# Patient Record
Sex: Female | Born: 1980 | Race: White | Hispanic: No | Marital: Married | Smoking: Never smoker
Health system: Southern US, Community
[De-identification: ages and names within clinical notes are randomized; demographics above are authoritative.]

## PROBLEM LIST (undated history)

## (undated) DIAGNOSIS — Z8742 Personal history of other diseases of the female genital tract: Secondary | ICD-10-CM

## (undated) DIAGNOSIS — E039 Hypothyroidism, unspecified: Secondary | ICD-10-CM

## (undated) DIAGNOSIS — R519 Headache, unspecified: Secondary | ICD-10-CM

## (undated) DIAGNOSIS — E079 Disorder of thyroid, unspecified: Secondary | ICD-10-CM

## (undated) HISTORY — DX: Disorder of thyroid, unspecified: E07.9

## (undated) HISTORY — DX: Personal history of other diseases of the female genital tract: Z87.42

## (undated) HISTORY — PX: ABDOMINAL HYSTERECTOMY: SHX81

---

## 2006-06-14 ENCOUNTER — Ambulatory Visit: Payer: Self-pay | Admitting: Surgery

## 2006-09-29 ENCOUNTER — Ambulatory Visit: Payer: Self-pay | Admitting: Obstetrics & Gynecology

## 2007-05-31 ENCOUNTER — Inpatient Hospital Stay: Payer: Self-pay | Admitting: Obstetrics & Gynecology

## 2008-11-26 ENCOUNTER — Ambulatory Visit: Payer: Self-pay | Admitting: Internal Medicine

## 2009-06-25 HISTORY — PX: THYROID SURGERY: SHX805

## 2011-03-08 ENCOUNTER — Emergency Department (HOSPITAL_COMMUNITY): Payer: PRIVATE HEALTH INSURANCE

## 2011-03-08 ENCOUNTER — Emergency Department (HOSPITAL_COMMUNITY)
Admission: EM | Admit: 2011-03-08 | Discharge: 2011-03-08 | Disposition: A | Discharge status: Home / Self Care | Payer: PRIVATE HEALTH INSURANCE | Attending: Emergency Medicine | Admitting: Emergency Medicine

## 2011-03-08 ENCOUNTER — Emergency Department (HOSPITAL_COMMUNITY): Payer: PRIVATE HEALTH INSURANCE | Attending: Emergency Medicine

## 2011-03-08 DIAGNOSIS — R51 Headache: Secondary | ICD-10-CM | POA: Insufficient documentation

## 2011-03-08 DIAGNOSIS — S81809A Unspecified open wound, unspecified lower leg, initial encounter: Secondary | ICD-10-CM | POA: Insufficient documentation

## 2011-03-08 DIAGNOSIS — M79609 Pain in unspecified limb: Secondary | ICD-10-CM | POA: Insufficient documentation

## 2011-03-08 DIAGNOSIS — M542 Cervicalgia: Secondary | ICD-10-CM | POA: Insufficient documentation

## 2011-03-08 DIAGNOSIS — S81009A Unspecified open wound, unspecified knee, initial encounter: Secondary | ICD-10-CM | POA: Insufficient documentation

## 2011-06-08 ENCOUNTER — Other Ambulatory Visit: Payer: Self-pay

## 2011-09-08 ENCOUNTER — Other Ambulatory Visit: Payer: Self-pay

## 2011-11-11 ENCOUNTER — Other Ambulatory Visit: Payer: Self-pay

## 2012-05-16 ENCOUNTER — Other Ambulatory Visit: Payer: Self-pay

## 2012-07-19 HISTORY — PX: INTRAUTERINE DEVICE (IUD) INSERTION: SHX5877

## 2012-12-16 ENCOUNTER — Other Ambulatory Visit: Payer: Self-pay

## 2012-12-16 LAB — TSH: Thyroid Stimulating Horm: 1.27 u[IU]/mL

## 2014-08-09 ENCOUNTER — Ambulatory Visit: Payer: Self-pay

## 2014-09-10 DIAGNOSIS — M5136 Other intervertebral disc degeneration, lumbar region: Secondary | ICD-10-CM | POA: Insufficient documentation

## 2014-09-10 DIAGNOSIS — M5116 Intervertebral disc disorders with radiculopathy, lumbar region: Secondary | ICD-10-CM | POA: Insufficient documentation

## 2014-09-15 ENCOUNTER — Ambulatory Visit: Payer: Self-pay | Admitting: Physical Medicine and Rehabilitation

## 2014-10-01 DIAGNOSIS — M5126 Other intervertebral disc displacement, lumbar region: Secondary | ICD-10-CM | POA: Insufficient documentation

## 2014-10-01 DIAGNOSIS — M5136 Other intervertebral disc degeneration, lumbar region: Secondary | ICD-10-CM | POA: Insufficient documentation

## 2015-11-15 ENCOUNTER — Telehealth: Payer: 59 | Admitting: Internal Medicine

## 2015-11-15 DIAGNOSIS — B001 Herpesviral vesicular dermatitis: Secondary | ICD-10-CM | POA: Diagnosis not present

## 2015-11-15 MED ORDER — ACYCLOVIR 400 MG PO TABS
800.0000 mg | ORAL_TABLET | Freq: Two times a day (BID) | ORAL | Status: DC
Start: 2015-11-15 — End: 2016-03-02

## 2015-11-15 NOTE — Progress Notes (Signed)
We are sorry that you are not feeling well.  Here is how we plan to help!  Based on what you have shared with me it does look like you have a viral infection.    Most cold sores or fever blisters are small fluid filled blisters around the mouth caused by herpes simplex virus.  The most common strain of the virus causing cold sores is herpes simplex virus 1.  It can be spread by skin contact, sharing eating utensils, or even sharing towels.  Cold sores are contagious to other people until dry. (Approximately 5-7 days).  Wash your hands. You can spread the virus to your eyes through handling your contact lenses after touching the lesions.  Most people experience pain at the sight or tingling sensations in their lips that may begin before the ulcers erupt.  Herpes simplex is treatable but not curable.  It may lie dormant for a long time and then reappear due to stress or prolonged sun exposure.  Many patients have success in treating their cold sores with an over the counter topical called Abreva.  You may apply the cream up to 5 times daily (maximum 10 days) until healing occurs.  If you would like to use an oral antiviral medication to speed the healing of your cold sore, I have sent a prescription to your local pharmacy Acyclovir 800 mg twice a day for 7 days    HOME CARE:   Wash your hands frequently.  Do not pick at or rub the sore.  Don't open the blisters.  Avoid kissing other people during this time.  Avoid sharing drinking glasses, eating utensils, or razors.  Do not handle contact lenses unless you have thoroughly washed your hands with soap and warm water!  Avoid oral sex during this time.  Herpes from sores on your mouth can spread to your partner's genital area.  Avoid contact with anyone who has eczema or a weakened immune system.  Cold sores are often triggered by exposure to intense sunlight, use a lip balm containing a sunscreen (SPF 30 or higher).  GET HELP RIGHT AWAY  IF:   Blisters look infected.  Blisters occur near or in the eye.  Symptoms last longer than 10 days.  Your symptoms become worse.  MAKE SURE YOU:   Understand these instructions.  Will watch your condition.  Will get help right away if you are not doing well or get worse.    Your e-visit answers were reviewed by a board certified advanced clinical practitioner to complete your personal care plan.  Depending upon the condition, your plan could have  Included both over the counter or prescription medications.    Please review your pharmacy choice.  Be sure that the pharmacy you have chosen is open so that you can pick up your prescription now.  If there is a problem you csn message your provider in MyChart to have the prescription routed to another pharmacy.    Your safety is important to us.  If you have drug allergies check our prescription carefully.  For the next 24 hours you can use MyChart to ask questions about today's visit, request a non-urgent call back, or ask for a work or school excuse from your e-visit provider.  You will get an email in the next two days asking about your experience.  I hope that your e-visit has been valuable and will speed your recovery.  

## 2015-12-25 MED FILL — LEVOTHYROXINE 125 MCG TAB: 125 | 90 days supply | Qty: 90 | Fill #1

## 2016-02-27 ENCOUNTER — Ambulatory Visit (HOSPITAL_COMMUNITY)
Admission: RE | Admit: 2016-02-27 | Discharge: 2016-02-27 | Disposition: A | Discharge status: Home / Self Care | Payer: 59 | Source: Ambulatory Visit | Attending: Family Medicine | Admitting: Family Medicine

## 2016-02-27 ENCOUNTER — Other Ambulatory Visit: Payer: Self-pay | Admitting: Family Medicine

## 2016-02-27 DIAGNOSIS — M79605 Pain in left leg: Secondary | ICD-10-CM | POA: Diagnosis not present

## 2016-02-27 DIAGNOSIS — M7989 Other specified soft tissue disorders: Secondary | ICD-10-CM | POA: Diagnosis not present

## 2016-02-27 DIAGNOSIS — M79662 Pain in left lower leg: Secondary | ICD-10-CM | POA: Diagnosis not present

## 2016-02-28 ENCOUNTER — Ambulatory Visit (INDEPENDENT_AMBULATORY_CARE_PROVIDER_SITE_OTHER): Payer: 59 | Admitting: Family Medicine

## 2016-02-28 ENCOUNTER — Other Ambulatory Visit: Payer: Self-pay | Admitting: *Deleted

## 2016-02-28 DIAGNOSIS — M79605 Pain in left leg: Secondary | ICD-10-CM

## 2016-02-28 DIAGNOSIS — M79604 Pain in right leg: Secondary | ICD-10-CM

## 2016-03-02 ENCOUNTER — Ambulatory Visit (INDEPENDENT_AMBULATORY_CARE_PROVIDER_SITE_OTHER): Payer: 59 | Admitting: Family Medicine

## 2016-03-02 VITALS — BP 91/48 | Ht 65.0 in | Wt 154.0 lb

## 2016-03-02 DIAGNOSIS — M25562 Pain in left knee: Secondary | ICD-10-CM | POA: Diagnosis not present

## 2016-03-03 DIAGNOSIS — E042 Nontoxic multinodular goiter: Secondary | ICD-10-CM | POA: Insufficient documentation

## 2016-03-03 DIAGNOSIS — E039 Hypothyroidism, unspecified: Secondary | ICD-10-CM | POA: Insufficient documentation

## 2016-03-03 DIAGNOSIS — T148XXA Other injury of unspecified body region, initial encounter: Secondary | ICD-10-CM | POA: Insufficient documentation

## 2016-03-03 NOTE — Progress Notes (Signed)
Patient has scheduled appointment but the last minute had to cancel secondary to family illness. I had our reopened the encounter. There is no charge for today.

## 2016-03-03 NOTE — Progress Notes (Signed)
Patient ID: Clearance CootsHeather W Renderos, female   DOB: 12/29/1980, 35 y.o.   MRN: [id number]  Clearance CootsHeather W Segoviano - 35 y.o. female MRN [id number]  Date of birth: 09/29/1981    SUBJECTIVE:     Chief Complaint: Left lower leg pain  HPI: Date of injury  02/22/2016 Her dog ran into her shin with his very large head. She felt immediate pain. Had significant swelling such that it looked like she had "an extra kneecap" in the middle of her shin area. She iced it. She was able to continue doing her activities. That night she started having aching pain in the area was very swollen and sore. Since that time she's had some decrease in the swelling and soreness. She's been continuing her regular activities but finds it painful to hop on that leg, painful of somebody accidentally knocks against that leg. ROS:     No unusual weight change, fever, sweats, chills.  PERTINENT  PMH / PSH FH / / SH:  Past Medical, Surgical, Social, and Family History Reviewed & Updated in the EMR.  Pertinent findings include:  No personal history diabetes mellitus No prior history of any injury to the left lower leg No history of abnormal bleeding or unusual bruising  OBJECTIVE: BP 91/48 mmHg  Ht 5\' 5"  (1.651 m)  Wt 154 lb (69.854 kg)  BMI 25.63 kg/m2  Physical Exam:  Vital signs are reviewed. GEN.: Well-developed female no acute distress LEGS: The lower legs are asymmetrical with the left one being somewhat swollen particularly in the anterior area. Is also tender to palpation in the mid shin area . The calf itself is soft and there is negative Homans sign. She has full range of motion at the knee and ankle area SKIN:there is small amount of ecchymoses at the midportion of the shin. There is a very small 2-4 mm break in the skin which has a scab on it. IMAGING: She shows me a picture taken her after the event and it looks like she has a 6 or 7 mm in diameter large swollen area mid shin. Plain film x-rays taken on April 6 are read as no acute  bony findings. There is some evidence of soft tissue swelling noted over the anterior tibia in the upper portion. I personal read of the x-rays agrees with that but there is an addition from my reading of a question of a linear defects in the medial fibula, right over the area of soft tissue swelling. This is linear, irregular, subcortical. I do not see a well-defined cortical break. ASSESSMENT & PLAN:  See problem based charting & AVS for pt instructions.

## 2016-03-03 NOTE — Assessment & Plan Note (Signed)
She still having a fair amount of pain. The swelling has improved. As she is now essentially 10 days out from the injury, the level of pain she's having with jumping or hopping is worrisome. I am concerned also that there may be a subtle finding on the initial x-ray. (occult linear fracture fibula)  We discussed possibly getting a CT scan which would give me much better look at the bone. I do not think we would need MRI. She would like to hold off on that because she's not reached her deductible in her insurance coverage. We decided to place her in a long Aircast, activities of daily living as tolerated. I discussed red flags such as swelling, redness or increased pain or anything that would indicate DVT. 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 Like to see her back in a week. If she still having pain we could potentially re-x-ray it to see if there is some delayed finding on x-ray. Additionally we could reconsider CT scan. If she's doing quite well, then we probably need to new no further workup.

## 2016-03-05 MED FILL — PROPRANOLOL 10 MG TABLET: 10 | 30 days supply | Qty: 90 | Fill #0

## 2016-03-10 ENCOUNTER — Encounter: Payer: Self-pay | Admitting: Sports Medicine

## 2016-03-10 ENCOUNTER — Ambulatory Visit (INDEPENDENT_AMBULATORY_CARE_PROVIDER_SITE_OTHER): Payer: 59 | Admitting: Sports Medicine

## 2016-03-10 VITALS — BP 103/70 | HR 72 | Ht 65.0 in | Wt 154.0 lb

## 2016-03-10 DIAGNOSIS — T148 Other injury of unspecified body region: Secondary | ICD-10-CM

## 2016-03-10 DIAGNOSIS — T148XXA Other injury of unspecified body region, initial encounter: Secondary | ICD-10-CM

## 2016-03-10 NOTE — Progress Notes (Signed)
Patient ID: Clearance CootsHeather W Sibrian, female   DOB: 01/04/1981, 35 y.o.   MRN: [id number]  CC:  Follow up of large contusion to the left anterior shin  PMI: Contusion on left shin with collision with her dog about 3 weeks ago Still tender to touch, but not real painful while walking Has discontinued wearing the aircast X-rays did not reveal any clear fracture There is extensive soft tissue swelling She has a picture that shows that the swelling was 2-3 times as large as today  She now can walk pain free Her only tenderness is if she bumps the anterior shin She stopped the Aircast over the last few days  Review of systems Denies swelling in any joint No weakness in the left leg  Physical examination Pleasant young female in no acute distress BP 103/70 mmHg  Pulse 72  Ht 5\' 5"  (1.651 m)  Wt 154 lb (69.854 kg)  BMI 25.63 kg/m2  Left anterior shin shows a swollen area that is now 3 cm long and 2 cm wide This is soft and freely movable Is tender to pressure No present discoloration  Squeeze of the tibia and fibula cause no pain Walking gait without limp   [facility] ultrasound Shows left tibial seroma This as well visualized on both longitudinal and transverse scans

## 2016-03-10 NOTE — Assessment & Plan Note (Signed)
Patient had a severe contusion which caused the bruising and swelling to resemble a fracture  She is left with a residual seroma  With her symptoms resolving I advised her that this will gradually go away  she can return if it becomes more painful

## 2016-04-01 DIAGNOSIS — E039 Hypothyroidism, unspecified: Secondary | ICD-10-CM | POA: Diagnosis not present

## 2016-04-08 DIAGNOSIS — E039 Hypothyroidism, unspecified: Secondary | ICD-10-CM | POA: Diagnosis not present

## 2016-04-08 MED FILL — LEVOTHYROXINE 137 MCG TAB: 137 | 90 days supply | Qty: 90 | Fill #0

## 2016-05-05 ENCOUNTER — Other Ambulatory Visit (HOSPITAL_COMMUNITY): Payer: Self-pay | Admitting: Neurosurgery

## 2016-05-05 DIAGNOSIS — M5136 Other intervertebral disc degeneration, lumbar region: Secondary | ICD-10-CM | POA: Diagnosis not present

## 2016-05-05 DIAGNOSIS — M47816 Spondylosis without myelopathy or radiculopathy, lumbar region: Secondary | ICD-10-CM | POA: Diagnosis not present

## 2016-05-05 DIAGNOSIS — M549 Dorsalgia, unspecified: Secondary | ICD-10-CM | POA: Diagnosis not present

## 2016-05-05 DIAGNOSIS — M5440 Lumbago with sciatica, unspecified side: Secondary | ICD-10-CM | POA: Diagnosis not present

## 2016-05-05 DIAGNOSIS — M5126 Other intervertebral disc displacement, lumbar region: Secondary | ICD-10-CM | POA: Diagnosis not present

## 2016-05-05 DIAGNOSIS — M5416 Radiculopathy, lumbar region: Secondary | ICD-10-CM | POA: Diagnosis not present

## 2016-05-05 DIAGNOSIS — M546 Pain in thoracic spine: Secondary | ICD-10-CM | POA: Diagnosis not present

## 2016-05-16 ENCOUNTER — Ambulatory Visit (HOSPITAL_COMMUNITY)
Admission: RE | Admit: 2016-05-16 | Discharge: 2016-05-16 | Disposition: A | Discharge status: Home / Self Care | Payer: 59 | Source: Ambulatory Visit | Attending: Neurosurgery | Admitting: Neurosurgery

## 2016-05-16 DIAGNOSIS — G959 Disease of spinal cord, unspecified: Secondary | ICD-10-CM | POA: Insufficient documentation

## 2016-05-16 DIAGNOSIS — M5126 Other intervertebral disc displacement, lumbar region: Secondary | ICD-10-CM | POA: Diagnosis not present

## 2016-05-16 DIAGNOSIS — M5136 Other intervertebral disc degeneration, lumbar region: Secondary | ICD-10-CM | POA: Diagnosis not present

## 2016-05-27 DIAGNOSIS — M47816 Spondylosis without myelopathy or radiculopathy, lumbar region: Secondary | ICD-10-CM | POA: Diagnosis not present

## 2016-05-27 DIAGNOSIS — M5416 Radiculopathy, lumbar region: Secondary | ICD-10-CM | POA: Diagnosis not present

## 2016-05-27 DIAGNOSIS — M5440 Lumbago with sciatica, unspecified side: Secondary | ICD-10-CM | POA: Diagnosis not present

## 2016-05-27 DIAGNOSIS — Z6825 Body mass index (BMI) 25.0-25.9, adult: Secondary | ICD-10-CM | POA: Diagnosis not present

## 2016-05-27 DIAGNOSIS — M5136 Other intervertebral disc degeneration, lumbar region: Secondary | ICD-10-CM | POA: Diagnosis not present

## 2016-06-10 DIAGNOSIS — E039 Hypothyroidism, unspecified: Secondary | ICD-10-CM | POA: Diagnosis not present

## 2016-07-07 MED FILL — LEVOTHYROXINE 137 MCG TAB: 137 | 30 days supply | Qty: 30 | Fill #1

## 2016-08-10 MED FILL — LEVOTHYROXINE 137 MCG TAB: 137 | 90 days supply | Qty: 90 | Fill #0

## 2016-08-27 ENCOUNTER — Telehealth: Payer: 59 | Admitting: Physician Assistant

## 2016-08-27 DIAGNOSIS — B9789 Other viral agents as the cause of diseases classified elsewhere: Secondary | ICD-10-CM | POA: Diagnosis not present

## 2016-08-27 DIAGNOSIS — J329 Chronic sinusitis, unspecified: Secondary | ICD-10-CM | POA: Diagnosis not present

## 2016-08-27 MED ORDER — AZELASTINE HCL 0.1 % NA SOLN: 2.0000 | Freq: Two times a day (BID) | NASAL | 12 refills | Status: DC

## 2016-08-27 NOTE — Progress Notes (Signed)

## 2016-10-05 DIAGNOSIS — Z0000 Encounter for general adult medical examination without abnormal findings: Secondary | ICD-10-CM | POA: Diagnosis not present

## 2016-10-12 DIAGNOSIS — E039 Hypothyroidism, unspecified: Secondary | ICD-10-CM | POA: Diagnosis not present

## 2016-10-12 DIAGNOSIS — Z0000 Encounter for general adult medical examination without abnormal findings: Secondary | ICD-10-CM | POA: Diagnosis not present

## 2016-11-13 MED FILL — LEVOTHYROXINE 137 MCG TAB: 137 | 90 days supply | Qty: 90 | Fill #1

## 2016-12-02 ENCOUNTER — Telehealth: Payer: 59 | Admitting: Nurse Practitioner

## 2016-12-02 DIAGNOSIS — M5442 Lumbago with sciatica, left side: Secondary | ICD-10-CM | POA: Diagnosis not present

## 2016-12-02 DIAGNOSIS — G8929 Other chronic pain: Secondary | ICD-10-CM | POA: Diagnosis not present

## 2016-12-02 MED ORDER — NAPROXEN 500 MG PO TABS: 500.0000 mg | ORAL_TABLET | Freq: Two times a day (BID) | ORAL | 1 refills | Status: DC

## 2016-12-02 MED ORDER — CYCLOBENZAPRINE HCL 10 MG PO TABS: 10.0000 mg | ORAL_TABLET | Freq: Three times a day (TID) | ORAL | 1 refills | Status: DC | PRN

## 2016-12-02 MED FILL — NAPROXEN 500 MG TABLET: 500 | 30 days supply | Qty: 60 | Fill #0

## 2016-12-02 MED FILL — CYCLOBENZAPRINE 10 MG TAB: 10 | 10 days supply | Qty: 30 | Fill #0

## 2016-12-02 NOTE — Progress Notes (Signed)

## 2017-03-04 MED FILL — LEVOTHYROXINE 137 MCG TAB: 137 | 90 days supply | Qty: 90 | Fill #0

## 2017-04-20 DIAGNOSIS — E039 Hypothyroidism, unspecified: Secondary | ICD-10-CM | POA: Diagnosis not present

## 2017-04-26 DIAGNOSIS — E039 Hypothyroidism, unspecified: Secondary | ICD-10-CM | POA: Diagnosis not present

## 2017-05-12 ENCOUNTER — Ambulatory Visit (INDEPENDENT_AMBULATORY_CARE_PROVIDER_SITE_OTHER): Payer: 59 | Admitting: Certified Nurse Midwife

## 2017-05-12 ENCOUNTER — Encounter: Payer: Self-pay | Admitting: Certified Nurse Midwife

## 2017-05-12 VITALS — BP 102/62 | HR 69 | Ht 65.5 in | Wt 161.0 lb

## 2017-05-12 DIAGNOSIS — Z87410 Personal history of cervical dysplasia: Secondary | ICD-10-CM

## 2017-05-12 DIAGNOSIS — Z124 Encounter for screening for malignant neoplasm of cervix: Secondary | ICD-10-CM | POA: Diagnosis not present

## 2017-05-12 DIAGNOSIS — Z8041 Family history of malignant neoplasm of ovary: Secondary | ICD-10-CM | POA: Diagnosis not present

## 2017-05-12 DIAGNOSIS — Z01419 Encounter for gynecological examination (general) (routine) without abnormal findings: Secondary | ICD-10-CM | POA: Diagnosis not present

## 2017-05-12 NOTE — Progress Notes (Signed)
Gynecology Annual Exam  PCP: Marisue Ivan, MD  Chief Complaint:  Chief Complaint  Patient presents with  . Gynecologic Exam    History of Present Illness:Patricia Mendez is a 36 year old Caucasian/White female , G 1 P 1 0 0 1 , who presents for her NP annual exam . She is having no significant gyn concerns. Her Mirena IUD is expiring August of this year and desires replacement at that time.  Her menses are absent due to IUD. She had spotting one occasion 3 months ago. She has some cyclical breast tenderness and bloating. The patient's past medical history is notable for a history of Hashimoto's thyroiditis/hypothyroidism/goiter.  Since her last annual GYN exam dated 12/19/2013, she has had no significant changes in her health history. Has gained 14# over the last 3 years. She is sexually active. She is currently using an IUD for contraception. Mirena IUD was replaced on 07/19/2012.  Her most recent pap smear was obtained 12/19/2013 and was NIL/negative HRHPV. Previous Pap on 06/28/2012 and was LGSIL with HRHPV+. She had a colpo in Aug 2013 which revealed CINI.   Mammogram is not applicable.  There is no family history of breast cancer.  There is a possible family history of ovarian  cancer in her 70 year old paternal aunt. Genetic testing has not been done.  The patient does do occasional self breast exams.  The patient does not smoke.  The patient does drink infrequently.  The patient does not use illegal drugs.  The patient exercises regularly.  The patient does get adequate calcium in her diet.  She had a recent cholesterol screen in 2017by PCP Dr Burnadette Pop that was normal.    The patient denies current symptoms of depression.    Review of Systems: Review of Systems  Constitutional: Negative for chills, fever and weight loss.       Positive for weight gain  HENT: Negative for congestion, sinus pain and sore throat.   Eyes: Negative for blurred vision and pain.    Respiratory: Negative for hemoptysis, shortness of breath and wheezing.   Cardiovascular: Negative for chest pain, palpitations and leg swelling.  Gastrointestinal: Negative for abdominal pain, blood in stool, diarrhea, heartburn, nausea and vomiting.  Genitourinary: Negative for dysuria, frequency, hematuria and urgency.       Positive for amenorrhea  Musculoskeletal: Negative for back pain, joint pain and myalgias.  Skin: Negative for itching and rash.  Neurological: Negative for dizziness, tingling and headaches.  Endo/Heme/Allergies: Negative for environmental allergies and polydipsia. Does not bruise/bleed easily.       Negative for hirsutism   Psychiatric/Behavioral: Negative for depression. The patient is not nervous/anxious and does not have insomnia.     Past Medical History:  Past Medical History:  Diagnosis Date  . History of abnormal cervical Pap smear   . Thyroid disease    hashimoto's, goiter    Past Surgical History:  Past Surgical History:  Procedure Laterality Date  . INTRAUTERINE DEVICE (IUD) INSERTION  07/19/2012  . THYROID SURGERY  06/25/2009   bilateral FNA    Family History:  Family History  Problem Relation Age of Onset  . Diabetes Father   . Hypertension Father   . Heart attack Father   . Hypertension Mother   . Ovarian cancer Paternal Aunt   . Lymphoma Paternal Grandmother   . Lung cancer Paternal Grandfather     Social History:  Social History   Social History  . Marital  status: Married    Spouse name: N/A  . Number of children: 1  . Years of education: N/A   Occupational History  . IT trainerClinical Informatics Manager    Social History Main Topics  . Smoking status: Never Smoker  . Smokeless tobacco: Never Used  . Alcohol use No  . Drug use: No  . Sexual activity: Yes    Partners: Male    Birth control/ protection: IUD   Other Topics Concern  . Not on file   Social History Narrative  . No narrative on file    Allergies:  No  Known Allergies  Medications: Prior to Admission medications   Medication Sig Start Date End Date Taking? Authorizing Provider  levonorgestrel (MIRENA) 20 MCG/24HR IUD 1 each by Intrauterine route once.   Yes [provider]  levothyroxine (SYNTHROID, LEVOTHROID) 137 MCG tablet  03/04/17  Yes [provider]    Physical Exam Vitals: BP 102/62   Pulse 69   Ht 5' 5.5" (1.664 m)   Wt 73 kg (161 lb)   LMP  (Exact Date)   BMI 26.38 kg/m   General: WF in NAD HEENT: normocephalic, anicteric Neck: no thyroid enlargement, no palpable nodules, no cervical lymphadenopathy  Pulmonary: No increased work of breathing, CTAB Cardiovascular: RRR, without murmur  Breast: Breast symmetrical, no tenderness, no palpable nodules or masses, no skin or nipple retraction present, no nipple discharge.  No axillary, infraclavicular or supraclavicular lymphadenopathy. Abdomen: Soft, non-tender, non-distended.  Umbilicus without lesions.  No hepatomegaly or masses palpable. No evidence of hernia. Genitourinary:  External: Normal external female genitalia.  Normal urethral meatus, normal Bartholin's and Skene's glands.    Vagina: Normal vaginal mucosa, no evidence of prolapse.    Cervix: Grossly normal in appearance, no bleeding, non-tender, IUD strings visible  Uterus: Anteflexeded, normal size, shape, and consistency, mobile, and non-tender  Adnexa: No adnexal masses, non-tender  Rectal: deferred  Lymphatic: no evidence of inguinal lymphadenopathy Extremities: no edema, erythema, or tenderness Neurologic: Grossly intact Psychiatric: mood appropriate, affect full     Assessment: 36 y.o. G1P1001 normal well woman exam Family history of ovarian cancer.  Plan:   1) Breast cancer screening - recommend monthly self breast exam. Interested in baseline mammogram. Advised to check with insurance to see if covered before age 37.  2) Cervical cancer screening - Pap was done. ASCCP guidelines  and rational discussed.    3) Contraception -Advised to call to schedule either Mirena or Liletta IUD in August/Sept. Follow up at that time  4) Routine healthcare maintenance including cholesterol and diabetes screening managed by PCP   5). Offered MYRISK testing-patient considering.  Patricia Connersolleen Terren Mendez, CNM

## 2017-05-16 LAB — IGP,CTNG,APTIMAHPV
Chlamydia, Nuc. Acid Amp: NEGATIVE
GONOCOCCUS BY NUCLEIC ACID AMP: NEGATIVE
HPV APTIMA: POSITIVE — AB
PAP SMEAR COMMENT: 0

## 2017-05-17 ENCOUNTER — Encounter: Payer: Self-pay | Admitting: Certified Nurse Midwife

## 2017-05-17 DIAGNOSIS — Z8041 Family history of malignant neoplasm of ovary: Secondary | ICD-10-CM | POA: Insufficient documentation

## 2017-05-20 ENCOUNTER — Telehealth: Payer: 59 | Admitting: Family

## 2017-05-20 DIAGNOSIS — B001 Herpesviral vesicular dermatitis: Secondary | ICD-10-CM

## 2017-05-20 MED ORDER — VALACYCLOVIR HCL 500 MG PO TABS: 2000.0000 mg | ORAL_TABLET | Freq: Two times a day (BID) | ORAL | 0 refills | Status: AC

## 2017-05-20 MED FILL — VALACYCLOVIR HCL 500 MG TAB: 500 | 1 days supply | Qty: 8 | Fill #0

## 2017-05-20 NOTE — Progress Notes (Signed)
Thank you for the details you put in the comment boxes. Those details really help us take better care of you.   We are sorry that you are not feeling well.  Here is how we plan to help!  Based on what you have shared with me it does look like you have a viral infection.    Most cold sores or fever blisters are small fluid filled blisters around the mouth caused by herpes simplex virus.  The most common strain of the virus causing cold sores is herpes simplex virus 1.  It can be spread by skin contact, sharing eating utensils, or even sharing towels.  Cold sores are contagious to other people until dry. (Approximately 5-7 days).  Wash your hands. You can spread the virus to your eyes through handling your contact lenses after touching the lesions.  Most people experience pain at the sight or tingling sensations in their lips that may begin before the ulcers erupt.  Herpes simplex is treatable but not curable.  It may lie dormant for a long time and then reappear due to stress or prolonged sun exposure.  Many patients have success in treating their cold sores with an over the counter topical called Abreva.  You may apply the cream up to 5 times daily (maximum 10 days) until healing occurs.  If you would like to use an oral antiviral medication to speed the healing of your cold sore, I have sent a prescription to your local pharmacy Valacyclovir 2 gm twice daily for 1 day    HOME CARE:   Wash your hands frequently.  Do not pick at or rub the sore.  Don't open the blisters.  Avoid kissing other people during this time.  Avoid sharing drinking glasses, eating utensils, or razors.  Do not handle contact lenses unless you have thoroughly washed your hands with soap and warm water!  Avoid oral sex during this time.  Herpes from sores on your mouth can spread to your partner's genital area.  Avoid contact with anyone who has eczema or a weakened immune system.  Cold sores are often triggered  by exposure to intense sunlight, use a lip balm containing a sunscreen (SPF 30 or higher).  GET HELP RIGHT AWAY IF:   Blisters look infected.  Blisters occur near or in the eye.  Symptoms last longer than 10 days.  Your symptoms become worse.  MAKE SURE YOU:   Understand these instructions.  Will watch your condition.  Will get help right away if you are not doing well or get worse.    Your e-visit answers were reviewed by a board certified advanced clinical practitioner to complete your personal care plan.  Depending upon the condition, your plan could have  Included both over the counter or prescription medications.    Please review your pharmacy choice.  Be sure that the pharmacy you have chosen is open so that you can pick up your prescription now.  If there is a problem you csn message your provider in MyChart to have the prescription routed to another pharmacy.    Your safety is important to us.  If you have drug allergies check our prescription carefully.  For the next 24 hours you can use MyChart to ask questions about today's visit, request a non-urgent call back, or ask for a work or school excuse from your e-visit provider.  You will get an email in the next two days asking about your experience.  I hope that   your e-visit has been valuable and will speed your recovery.  

## 2017-05-28 ENCOUNTER — Encounter: Payer: Self-pay | Admitting: Certified Nurse Midwife

## 2017-06-08 MED FILL — LEVOTHYROXINE 137 MCG TAB: 137 | 90 days supply | Qty: 90 | Fill #1

## 2017-07-02 ENCOUNTER — Telehealth: Payer: Self-pay | Admitting: Certified Nurse Midwife

## 2017-07-02 NOTE — Telephone Encounter (Signed)
Pt is schedule 07/29/17 with [facility] for mirena removal and insertion

## 2017-07-07 NOTE — Telephone Encounter (Signed)
Noted. Will order to arrive by apt date/time. 

## 2017-07-29 ENCOUNTER — Ambulatory Visit (INDEPENDENT_AMBULATORY_CARE_PROVIDER_SITE_OTHER): Payer: 59 | Admitting: Certified Nurse Midwife

## 2017-07-29 ENCOUNTER — Encounter: Payer: Self-pay | Admitting: Certified Nurse Midwife

## 2017-07-29 VITALS — BP 98/62 | HR 72 | Ht 65.0 in | Wt 170.0 lb

## 2017-07-29 DIAGNOSIS — Z30433 Encounter for removal and reinsertion of intrauterine contraceptive device: Secondary | ICD-10-CM

## 2017-07-29 MED ORDER — LEVONORGESTREL 20 MCG/24HR IU IUD: 1.0000 | INTRAUTERINE_SYSTEM | Freq: Once | INTRAUTERINE | 0 refills | Status: DC

## 2017-07-29 NOTE — Progress Notes (Signed)
    GYNECOLOGY OFFICE PROCEDURE NOTE  Patricia CootsHeather W Mendez is a 36 y.o. G1P1001 here for Mirena IUD replacement. Her previous Mirena was placed 07/19/2012 and is expiring. She has benn amenorrheic on the Mirena.  Last pap smear was on 05/12/2017 and was negative with positive HRHPV  IUD Insertion Procedure Note Patient identified, informed consent performed, consent signed.   Discussed risks of irregular bleeding, cramping, infection, expulsion,malpositioning or misplacement of the IUD outside the uterus which may require further procedure such as laparoscopy. Time out was performed.   On bimanual exam, uterus was anteflexed. Speculum placed in the vagina. Expiring IUD strings visualized. Cervix cleaned with Betadine x 3. The IUD strings were grasped with a packing forceps and the IUD was removed easily and intact. Cervix was sprayed with Hurricaine anesthetic and  grasped anteriorly with a single tooth tenaculum.  Uterus sounded to 6 cm.  Mirena  IUD placed per manufacturer's recommendations.  Strings trimmed to 3 cm. Tenaculum was removed, and silver nitrate was applied to tenaculum sites for hemostasis.  Patient tolerated procedure well.   Patient was given post-procedure instructions.  Patient was also asked to check IUD strings periodically and follow up in June of next year and prn. She is aware that the IUD will expire in 5 years.  Patricia Mendez, CNM 07/29/17

## 2017-08-30 ENCOUNTER — Encounter: Payer: Self-pay | Admitting: Certified Nurse Midwife

## 2017-08-30 ENCOUNTER — Ambulatory Visit (INDEPENDENT_AMBULATORY_CARE_PROVIDER_SITE_OTHER): Payer: 59 | Admitting: Certified Nurse Midwife

## 2017-08-30 VITALS — BP 90/60 | HR 63 | Ht 65.0 in | Wt 172.0 lb

## 2017-08-30 DIAGNOSIS — Z30431 Encounter for routine checking of intrauterine contraceptive device: Secondary | ICD-10-CM | POA: Diagnosis not present

## 2017-09-06 NOTE — Progress Notes (Signed)
  History of Present Illness:  Patricia Mendez is a 36 y.o. that had a Mirena IUD replaced approximately 1 month ago. Since that time, she states that she has had no bleeding or pain  PMHx: She  has a past medical history of History of abnormal cervical Pap smear and Thyroid disease. Also,  has a past surgical history that includes Thyroid surgery (06/25/2009) and Intrauterine device (iud) insertion (07/19/2012)., family history includes Diabetes in her father; Heart attack in her father; Hypertension in her father and mother; Lung cancer in her paternal grandfather; Lymphoma in her paternal grandmother; Ovarian cancer in her paternal aunt.,  reports that she has never smoked. She has never used smokeless tobacco. She reports that she does not drink alcohol or use drugs.  She has a current medication list which includes the following prescription(s): levothyroxine and levonorgestrel. Also, has No Known Allergies.  ROS  Physical Exam:  BP 90/60   Pulse 63   Ht  (1.651 m)   Wt 172 lb (78 kg)   BMI 28.62 kg/m  Body mass index is 28.62 kg/m. Constitutional: Well nourished, well developed female in no acute distress.  Abdomen: diffusely non tender to palpation Neuro: Grossly intact Psych:  Normal mood and affect.    Pelvic exam: External/BUS: no lesion, no discharge Vagina: no lesions, no bleeding Cervix: Two IUD strings present at the cervical os about 2-3cm in length   Assessment: IUD strings present in proper location; pt doing well  Plan:  Will see her back for her annual in June 2019 and prn  Farrel Conners, CNM

## 2017-09-08 ENCOUNTER — Telehealth: Payer: 59 | Admitting: Family

## 2017-09-08 DIAGNOSIS — J019 Acute sinusitis, unspecified: Secondary | ICD-10-CM

## 2017-09-08 MED ORDER — AMOXICILLIN-POT CLAVULANATE 875-125 MG PO TABS: 1.0000 | ORAL_TABLET | Freq: Two times a day (BID) | ORAL | 0 refills | Status: DC

## 2017-09-08 NOTE — Progress Notes (Signed)

## 2017-09-09 MED FILL — AMOX-CLAV 875-125 MG TABLET: 875-125 | 7 days supply | Qty: 14 | Fill #0

## 2017-09-09 MED FILL — LEVOTHYROXINE 137 MCG TAB: 137 | 90 days supply | Qty: 90 | Fill #0

## 2017-12-13 MED FILL — LEVOTHYROXINE 137 MCG TABLE: 137 | 90 days supply | Qty: 90 | Fill #1

## 2018-03-02 MED FILL — LEVOTHYROXINE 137 MCG TABLE: 137 | 90 days supply | Qty: 90 | Fill #0

## 2018-03-22 DIAGNOSIS — Z0000 Encounter for general adult medical examination without abnormal findings: Secondary | ICD-10-CM | POA: Diagnosis not present

## 2018-03-29 DIAGNOSIS — Z0000 Encounter for general adult medical examination without abnormal findings: Secondary | ICD-10-CM | POA: Diagnosis not present

## 2018-03-29 DIAGNOSIS — E039 Hypothyroidism, unspecified: Secondary | ICD-10-CM | POA: Diagnosis not present

## 2018-06-21 MED FILL — LEVOTHYROXINE 137 MCG TABLE: 137 | 90 days supply | Qty: 90 | Fill #0

## 2018-09-16 MED FILL — LEVOTHYROXINE 137 MCG TABLE: 137 | 90 days supply | Qty: 90 | Fill #0

## 2018-10-14 DIAGNOSIS — E039 Hypothyroidism, unspecified: Secondary | ICD-10-CM | POA: Diagnosis not present

## 2018-10-19 DIAGNOSIS — E039 Hypothyroidism, unspecified: Secondary | ICD-10-CM | POA: Diagnosis not present

## 2018-10-19 DIAGNOSIS — E669 Obesity, unspecified: Secondary | ICD-10-CM | POA: Diagnosis not present

## 2018-10-19 MED FILL — LEVOTHYROXINE 150 MCG TAB: 150 | 30 days supply | Qty: 30 | Fill #0

## 2018-10-31 MED FILL — LEVOTHYROXINE 150 MCG TAB: 150 | 30 days supply | Qty: 30 | Fill #0

## 2018-11-28 MED FILL — LEVOTHYROXINE 150 MCG TAB: 150 | 30 days supply | Qty: 30 | Fill #1

## 2019-01-02 MED FILL — LEVOTHYROXINE 150 MCG TAB: 150 | 30 days supply | Qty: 30 | Fill #2

## 2019-01-26 MED FILL — LEVOTHYROXINE 150 MCG TAB: 150 | 90 days supply | Qty: 90 | Fill #0 | Status: TO

## 2019-04-25 MED FILL — LEVOTHYROXINE 150 MCG TAB: 150 | 90 days supply | Qty: 90 | Fill #0

## 2019-08-01 MED FILL — LEVOTHYROXINE 150 MCG TAB: 150 | 90 days supply | Qty: 90 | Fill #0

## 2019-10-24 MED FILL — LEVOTHYROXINE SODIUM 150 MC: 150 | 90 days supply | Qty: 90 | Fill #0

## 2020-01-05 ENCOUNTER — Telehealth: Payer: No Typology Code available for payment source | Admitting: Emergency Medicine

## 2020-01-05 ENCOUNTER — Ambulatory Visit: Payer: 59

## 2020-01-05 DIAGNOSIS — B001 Herpesviral vesicular dermatitis: Secondary | ICD-10-CM

## 2020-01-05 MED ORDER — ACYCLOVIR 800 MG PO TABS: 800.0000 mg | ORAL_TABLET | Freq: Two times a day (BID) | ORAL | 0 refills | Status: DC

## 2020-01-05 MED FILL — ACYCLOVIR 800 MG TABLET: 800 | 7 days supply | Qty: 14 | Fill #0

## 2020-01-05 NOTE — Progress Notes (Signed)
We are sorry that you are not feeling well.  Here is how we plan to help!  Based on what you have shared with me it does look like you have a viral infection.    Most cold sores or fever blisters are small fluid filled blisters around the mouth caused by herpes simplex virus.  The most common strain of the virus causing cold sores is herpes simplex virus 1.  It can be spread by skin contact, sharing eating utensils, or even sharing towels.  Cold sores are contagious to other people until dry. (Approximately 5-7 days).  Wash your hands. You can spread the virus to your eyes through handling your contact lenses after touching the lesions.  Most people experience pain at the sight or tingling sensations in their lips that may begin before the ulcers erupt.  Herpes simplex is treatable but not curable.  It may lie dormant for a long time and then reappear due to stress or prolonged sun exposure.  Many patients have success in treating their cold sores with an over the counter topical called Abreva.  You may apply the cream up to 5 times daily (maximum 10 days) until healing occurs.  If you would like to use an oral antiviral medication to speed the healing of your cold sore, I have sent a prescription to your local pharmacy Acyclovir 800 mg take one by mouth twice a day for 7 days    HOME CARE:   Wash your hands frequently.  Do not pick at or rub the sore.  Don't open the blisters.  Avoid kissing other people during this time.  Avoid sharing drinking glasses, eating utensils, or razors.  Do not handle contact lenses unless you have thoroughly washed your hands with soap and warm water!  Avoid oral sex during this time.  Herpes from sores on your mouth can spread to your partner's genital area.  Avoid contact with anyone who has eczema or a weakened immune system.  Cold sores are often triggered by exposure to intense sunlight, use a lip balm containing a sunscreen (SPF 30 or  higher).  GET HELP RIGHT AWAY IF:   Blisters look infected.  Blisters occur near or in the eye.  Symptoms last longer than 10 days.  Your symptoms become worse.  MAKE SURE YOU:   Understand these instructions.  Will watch your condition.  Will get help right away if you are not doing well or get worse.    Your e-visit answers were reviewed by a board certified advanced clinical practitioner to complete your personal care plan.  Depending upon the condition, your plan could have  Included both over the counter or prescription medications.    Please review your pharmacy choice.  Be sure that the pharmacy you have chosen is open so that you can pick up your prescription now.  If there is a problem you can message your provider in MyChart to have the prescription routed to another pharmacy.    Your safety is important to us.  If you have drug allergies check our prescription carefully.  For the next 24 hours you can use MyChart to ask questions about today's visit, request a non-urgent call back, or ask for a work or school excuse from your e-visit provider.  You will get an email in the next two days asking about your experience.  I hope that your e-visit has been valuable and will speed your recovery.   Approximately 5 minutes was used in   reviewing the patient's chart, questionnaire, prescribing medications, and documentation.

## 2020-02-09 MED FILL — LEVOTHYROXINE SODIUM 150 MC: 150 | 90 days supply | Qty: 90 | Fill #0

## 2020-05-15 MED FILL — LEVOTHYROXINE SODIUM 150 MC: 150 | 90 days supply | Qty: 90 | Fill #1

## 2020-08-21 ENCOUNTER — Other Ambulatory Visit (HOSPITAL_COMMUNITY): Payer: Self-pay | Admitting: Family Medicine

## 2020-08-21 MED FILL — LEVOTHYROXINE SODIUM 150 MC: 150 | 90 days supply | Qty: 90 | Fill #0

## 2020-09-02 ENCOUNTER — Telehealth: Payer: No Typology Code available for payment source | Admitting: Emergency Medicine

## 2020-09-02 DIAGNOSIS — B001 Herpesviral vesicular dermatitis: Secondary | ICD-10-CM | POA: Diagnosis not present

## 2020-09-02 MED ORDER — VALACYCLOVIR HCL 1 G PO TABS: 2000.0000 mg | ORAL_TABLET | Freq: Two times a day (BID) | ORAL | 0 refills | Status: AC

## 2020-09-02 NOTE — Progress Notes (Signed)
We are sorry that you are not feeling well.  Here is how we plan to help!  Based on what you have shared with me it does look like you have a viral infection.    Most cold sores or fever blisters are small fluid filled blisters around the mouth caused by herpes simplex virus.  The most common strain of the virus causing cold sores is herpes simplex virus 1.  It can be spread by skin contact, sharing eating utensils, or even sharing towels.  Cold sores are contagious to other people until dry. (Approximately 5-7 days).  Wash your hands. You can spread the virus to your eyes through handling your contact lenses after touching the lesions.  Most people experience pain at the sight or tingling sensations in their lips that may begin before the ulcers erupt.  Herpes simplex is treatable but not curable.  It may lie dormant for a long time and then reappear due to stress or prolonged sun exposure.  Many patients have success in treating their cold sores with an over the counter topical called Abreva.  You may apply the cream up to 5 times daily (maximum 10 days) until healing occurs.  If you would like to use an oral antiviral medication to speed the healing of your cold sore, I have sent a prescription to your local pharmacy Valacyclovir 2 gm take one by mouth twice a day for 1 day    HOME CARE:   Wash your hands frequently.  Do not pick at or rub the sore.  Don't open the blisters.  Avoid kissing other people during this time.  Avoid sharing drinking glasses, eating utensils, or razors.  Do not handle contact lenses unless you have thoroughly washed your hands with soap and warm water!  Avoid oral sex during this time.  Herpes from sores on your mouth can spread to your partner's genital area.  Avoid contact with anyone who has eczema or a weakened immune system.  Cold sores are often triggered by exposure to intense sunlight, use a lip balm containing a sunscreen (SPF 30 or  higher).  GET HELP RIGHT AWAY IF:   Blisters look infected.  Blisters occur near or in the eye.  Symptoms last longer than 10 days.  Your symptoms become worse.  MAKE SURE YOU:   Understand these instructions.  Will watch your condition.  Will get help right away if you are not doing well or get worse.    Your e-visit answers were reviewed by a board certified advanced clinical practitioner to complete your personal care plan.  Depending upon the condition, your plan could have  Included both over the counter or prescription medications.    Please review your pharmacy choice.  Be sure that the pharmacy you have chosen is open so that you can pick up your prescription now.  If there is a problem you can message your provider in MyChart to have the prescription routed to another pharmacy.    Your safety is important to [place].  If you have drug allergies check our prescription carefully.  For the next 24 hours you can use MyChart to ask questions about today's visit, request a non-urgent call back, or ask for a work or school excuse from your e-visit provider.  You will get an email in the next two days asking about your experience.  I hope that your e-visit has been valuable and will speed your recovery. **Please do not respond to this message unless  you have follow up questions.** Greater than 5 but less than 10 minutes spent researching, coordinating, and implementing care for this patient today

## 2020-10-02 ENCOUNTER — Telehealth: Payer: No Typology Code available for payment source | Admitting: Family

## 2020-10-02 DIAGNOSIS — J019 Acute sinusitis, unspecified: Secondary | ICD-10-CM | POA: Diagnosis not present

## 2020-10-02 MED ORDER — AMOXICILLIN-POT CLAVULANATE 875-125 MG PO TABS: 1.0000 | ORAL_TABLET | Freq: Two times a day (BID) | ORAL | 0 refills | Status: DC

## 2020-10-02 NOTE — Progress Notes (Signed)

## 2020-10-31 MED FILL — LEVOTHYROXINE SODIUM 150 MC: 150 | 30 days supply | Qty: 30 | Fill #1

## 2020-11-23 DIAGNOSIS — U071 COVID-19: Secondary | ICD-10-CM

## 2020-11-23 HISTORY — DX: COVID-19: U07.1

## 2020-11-28 MED FILL — LEVOTHYROXINE SODIUM 150 MC: 150 | 30 days supply | Qty: 30 | Fill #2

## 2020-12-31 MED FILL — LEVOTHYROXINE SODIUM 150 MC: 150 | 30 days supply | Qty: 30 | Fill #3

## 2021-01-24 ENCOUNTER — Other Ambulatory Visit (HOSPITAL_COMMUNITY): Payer: Self-pay | Admitting: Family Medicine

## 2021-01-24 MED FILL — LEVOTHYROXINE SODIUM 150 MC: 150 | 30 days supply | Qty: 30 | Fill #0

## 2021-02-14 ENCOUNTER — Other Ambulatory Visit (HOSPITAL_BASED_OUTPATIENT_CLINIC_OR_DEPARTMENT_OTHER): Payer: Self-pay

## 2021-04-03 ENCOUNTER — Ambulatory Visit (INDEPENDENT_AMBULATORY_CARE_PROVIDER_SITE_OTHER): Payer: No Typology Code available for payment source | Admitting: Nurse Practitioner

## 2021-04-03 ENCOUNTER — Other Ambulatory Visit: Payer: Self-pay

## 2021-04-03 ENCOUNTER — Telehealth (INDEPENDENT_AMBULATORY_CARE_PROVIDER_SITE_OTHER): Payer: Self-pay | Admitting: Nurse Practitioner

## 2021-04-03 VITALS — BP 114/68 | HR 76 | Temp 96.1°F | Ht 65.0 in | Wt 190.6 lb

## 2021-04-03 DIAGNOSIS — Z1231 Encounter for screening mammogram for malignant neoplasm of breast: Secondary | ICD-10-CM

## 2021-04-03 DIAGNOSIS — Z131 Encounter for screening for diabetes mellitus: Secondary | ICD-10-CM

## 2021-04-03 DIAGNOSIS — E039 Hypothyroidism, unspecified: Secondary | ICD-10-CM

## 2021-04-03 DIAGNOSIS — Z13220 Encounter for screening for lipoid disorders: Secondary | ICD-10-CM

## 2021-04-03 DIAGNOSIS — E663 Overweight: Secondary | ICD-10-CM | POA: Diagnosis not present

## 2021-04-03 DIAGNOSIS — R5383 Other fatigue: Secondary | ICD-10-CM

## 2021-04-03 NOTE — Patient Instructions (Signed)
[facility] Dietary Recommendations for Weight Loss What to Avoid . Avoid added sugars o Often added sugar can be found in processed foods such as many condiments, dry cereals, cakes, cookies, chips, crisps, crackers, candies, sweetened drinks, etc.  o Read labels and AVOID/DECREASE use of foods with the following in their ingredient list: Sugar, fructose, high fructose corn syrup, sucrose, glucose, maltose, dextrose, molasses, cane sugar, brown sugar, any type of syrup, agave nectar, etc.   . Avoid snacking in between meals . Avoid foods made with flour o If you are going to eat food made with flour, choose those made with whole-grains; and, minimize your consumption as much as is tolerable . Avoid processed foods o These foods are generally stocked in the middle of the grocery store. Focus on shopping on the perimeter of the grocery.  . Avoid Meat  o We recommend following a plant-based diet at [facility]. Thus, we recommend avoiding meat as a general rule. Consider eating beans, legumes, eggs, and/or dairy products for regular protein sources o If you plan on eating meat limit to 4 ounces of meat at a time and choose lean options such as Fish, chicken, turkey. Avoid red meat intake such as pork and/or steak What to Include . Vegetables o GREEN LEAFY VEGETABLES: Kale, spinach, mustard greens, collard greens, cabbage, broccoli, etc. o OTHER: Asparagus, cauliflower, eggplant, carrots, peas, Brussel sprouts, tomatoes, bell peppers, zucchini, beets, cucumbers, etc. . Grains, seeds, and legumes o Beans: kidney beans, black eyed peas, garbanzo beans, black beans, pinto beans, etc. o Whole, unrefined grains: brown rice, barley, bulgur, oatmeal, etc. . Healthy fats  o Avoid highly processed fats such as vegetable oil o Examples of healthy fats: avocado, olives, virgin olive oil, dark chocolate (?72% Cocoa), nuts (peanuts, almonds, walnuts, cashews, pecans, etc.) . None to Low  Intake of Animal Sources of Protein o Meat sources: chicken, turkey, salmon, tuna. Limit to 4 ounces of meat at one time. o Consider limiting dairy sources, but when choosing dairy focus on: PLAIN Greek yogurt, cottage cheese, high-protein milk . Fruit o Choose berries  When to Eat . Intermittent Fasting: o Choosing not to eat for a specific time period, but DO FOCUS ON HYDRATION when fasting o Multiple Techniques: - Time Restricted Eating: eat 3 meals in a day, each meal lasting no more than 60 minutes, no snacks between meals - 16-18 hour fast: fast for 16 to 18 hours up to 7 days a week. Often suggested to start with 2-3 nonconsecutive days per week.  . Remember the time you sleep is counted as fasting.  . Examples of eating schedule: Fast from 7:00pm-11:00am. Eat between 11:00am-7:00pm.  - 24-hour fast: fast for 24 hours up to every other day. Often suggested to start with 1 day per week . Remember the time you sleep is counted as fasting . Examples of eating schedule:  o Eating day: eat 2-3 meals on your eating day. If doing 2 meals, each meal should last no more than 90 minutes. If doing 3 meals, each meal should last no more than 60 minutes. Finish last meal by 7:00pm. o Fasting day: Fast until 7:00pm.  o IF YOU FEEL UNWELL FOR ANY REASON/IN ANY WAY WHEN FASTING, STOP FASTING BY EATING A NUTRITIOUS SNACK OR LIGHT MEAL o ALWAYS FOCUS ON HYDRATION DURING FASTS - Acceptable Hydration sources: water, broths, tea/coffee (black tea/coffee is best but using a small amount of whole-fat dairy products in coffee/tea is acceptable).  -   Poor Hydration Sources: anything with sugar or artificial sweeteners added to it  These recommendations have been developed for patients that are actively receiving medical care from either Dr. [facility] or Thalya Fouche, DNP, NP-C at [facility]. These recommendations are developed for patients with specific medical conditions and are not meant to be  distributed or used by others that are not actively receiving care from either provider listed above at [facility]. It is not appropriate to participate in the above eating plans without proper medical supervision.   Reference: Fung, J. The obesity code. [place]: [place]; 2016.   

## 2021-04-03 NOTE — Progress Notes (Signed)
Subjective:  Patient ID: Patricia Mendez, female    DOB: 09-Mar-1981  Age: 40 y.o. MRN: [id number]  CC:  Chief Complaint  Patient presents with  . Establish Care      HPI  This patient arrives today for the above.  She is here to establish care in our office.  Her main complaints today are fatigue which she thinks is related to her hypothyroidism.  She believes that she may need her TSH checked.  She is also experiencing difficulty with gaining weight and would like to discuss assistance with weight loss.  Past Medical History:  Diagnosis Date  . History of abnormal cervical Pap smear   . Thyroid disease    hashimoto's, goiter      Family History  Problem Relation Age of Onset  . Diabetes Father   . Hypertension Father   . Heart attack Father   . Hypertension Mother   . Ovarian cancer Paternal Aunt   . Lymphoma Paternal Grandmother   . Lung cancer Paternal Grandfather     Social History   Social History Narrative  . Not on file   Social History   Tobacco Use  . Smoking status: Never Smoker  . Smokeless tobacco: Never Used  Substance Use Topics  . Alcohol use: No    Alcohol/week: 0.0 standard drinks     Current Meds  Medication Sig  . levothyroxine (SYNTHROID, LEVOTHROID) 137 MCG tablet   . [DISCONTINUED] levothyroxine (SYNTHROID) 150 MCG tablet TAKE 1 TABLET BY MOUTH ONCE A DAY ON AN EMPTY STOMACH WITH A GLASS OF WATER AT LEAST 30-60 MINUTES BEFORE BREAKFAST    ROS:  Review of Systems  Constitutional: Positive for malaise/fatigue. Negative for fever and weight loss.  Respiratory: Negative for shortness of breath.   Cardiovascular: Negative for chest pain and palpitations.  Gastrointestinal: Negative for abdominal pain and blood in stool.  Neurological: Negative for dizziness and headaches.     Objective:   Today's Vitals: BP 114/68   Pulse 76   Temp (!) 96.1 F (35.6 C)   Ht _0  (1.651 m)   Wt 190 lb 9.6 oz (86.5 kg)   SpO2 98%   BMI  31.72 kg/m  Vitals with BMI 04/03/2021 08/30/2017 07/29/2017  Height _1  _2  _3   Weight 190 lbs 10 oz 172 lbs 170 lbs  BMI 31.72 50.27 74.12  Systolic 878 90 98  Diastolic 68 60 62  Pulse 76 63 72     Physical Exam Vitals reviewed.  Constitutional:      General: She is not in acute distress.    Appearance: Normal appearance.  HENT:     Head: Normocephalic and atraumatic.  Neck:     Vascular: No carotid bruit.  Cardiovascular:     Rate and Rhythm: Normal rate and regular rhythm.     Pulses: Normal pulses.     Heart sounds: Normal heart sounds.  Pulmonary:     Effort: Pulmonary effort is normal.     Breath sounds: Normal breath sounds.  Skin:    General: Skin is warm and dry.  Neurological:     General: No focal deficit present.     Mental Status: She is alert and oriented to person, place, and time.  Psychiatric:        Mood and Affect: Mood normal.        Behavior: Behavior normal.        Judgment: Judgment normal.  Assessment and Plan   1. Acquired hypothyroidism   2. Screening, lipid   3. Overweight (BMI 25.0-29.9)   4. Screening for diabetes mellitus   5. Fatigue, unspecified type   6. Encounter for screening mammogram for malignant neoplasm of breast      Plan: 1.-5.  We will check blood work today including TSH for further evaluation.  Further recommendations may be made based upon these results.  We also discussed weight loss and I recommended she focus on eating a mostly plant-based diet as well as consider trying intermittent fasting.  We talked about intermittent fasting had a approach this, how to do it safely, and make sure she maintains hydration during her fast.  She will consider trying a 16-hour fast a couple days a week between now her next appointment. 6.  We will order screening mammogram.  We also discussed Pap smear and she is not sure if she is overdue for this or not but she tells me she will call [facility] to determine  if she is due to come back for Pap smear.  Tests ordered Orders Placed This Encounter  Procedures  . MM Digital Screening  . CBC with Differential/Platelets  . CMP with eGFR([facility])  . Lipid Panel  . Hemoglobin A1c  . TSH      No orders of the defined types were placed in this encounter.   Patient to follow-up in 1 month for annual physical exam with Dr. Anastasio Champion or sooner as needed.  Ailene Ards, NP

## 2021-04-03 NOTE — Telephone Encounter (Signed)
-   Ordered screening mammogram today.

## 2021-04-04 ENCOUNTER — Other Ambulatory Visit (HOSPITAL_COMMUNITY): Payer: Self-pay

## 2021-04-04 ENCOUNTER — Other Ambulatory Visit (INDEPENDENT_AMBULATORY_CARE_PROVIDER_SITE_OTHER): Payer: Self-pay | Admitting: Nurse Practitioner

## 2021-04-04 DIAGNOSIS — E039 Hypothyroidism, unspecified: Secondary | ICD-10-CM

## 2021-04-04 LAB — HEMOGLOBIN A1C
Hgb A1c MFr Bld: 5.1 % of total Hgb (ref <5.7)
Mean Plasma Glucose: 100 mg/dL
eAG (mmol/L): 5.5 mmol/L

## 2021-04-04 LAB — LIPID PANEL
Cholesterol: 199 mg/dL (ref <200)
HDL: 70 mg/dL (ref >=50)
LDL Cholesterol (Calc): 108 mg/dL (calc) — ABNORMAL HIGH
Non-HDL Cholesterol (Calc): 129 mg/dL (calc) (ref <130)
Total CHOL/HDL Ratio: 2.8 (calc) (ref <5.0)
Triglycerides: 110 mg/dL (ref <150)

## 2021-04-04 LAB — COMPLETE METABOLIC PANEL WITH GFR
AG Ratio: 1.8 (calc) (ref 1.0–2.5)
ALT: 14 U/L (ref 6–29)
AST: 16 U/L (ref 10–30)
Albumin: 4.6 g/dL (ref 3.6–5.1)
Alkaline phosphatase (APISO): 52 U/L (ref 31–125)
BUN: 19 mg/dL (ref 7–25)
CO2: 24 mmol/L (ref 20–32)
Calcium: 9.4 mg/dL (ref 8.6–10.2)
Chloride: 102 mmol/L (ref 98–110)
Creat: 0.93 mg/dL (ref 0.50–1.10)
GFR, Est African American: 88 mL/min/{1.73_m2} (ref >=60)
GFR, Est Non African American: 76 mL/min/{1.73_m2} (ref >=60)
Globulin: 2.6 g/dL (calc) (ref 1.9–3.7)
Glucose, Bld: 87 mg/dL (ref 65–139)
Potassium: 4 mmol/L (ref 3.5–5.3)
Sodium: 136 mmol/L (ref 135–146)
Total Bilirubin: 0.4 mg/dL (ref 0.2–1.2)
Total Protein: 7.2 g/dL (ref 6.1–8.1)

## 2021-04-04 LAB — TSH: TSH: 3.41 mIU/L

## 2021-04-04 LAB — CBC WITH DIFFERENTIAL/PLATELET
Absolute Monocytes: 827 cells/uL (ref 200–950)
Basophils Absolute: 44 cells/uL (ref 0–200)
Basophils Relative: 0.5 %
Eosinophils Absolute: 97 cells/uL (ref 15–500)
Eosinophils Relative: 1.1 %
HCT: 41.4 % (ref 35.0–45.0)
Hemoglobin: 14.3 g/dL (ref 11.7–15.5)
Lymphs Abs: 1866 cells/uL (ref 850–3900)
MCH: 31.2 pg (ref 27.0–33.0)
MCHC: 34.5 g/dL (ref 32.0–36.0)
MCV: 90.2 fL (ref 80.0–100.0)
MPV: 10.2 fL (ref 7.5–12.5)
Monocytes Relative: 9.4 %
Neutro Abs: 5966 cells/uL (ref 1500–7800)
Neutrophils Relative %: 67.8 %
Platelets: 275 10*3/uL (ref 140–400)
RBC: 4.59 10*6/uL (ref 3.80–5.10)
RDW: 13 % (ref 11.0–15.0)
Total Lymphocyte: 21.2 %
WBC: 8.8 10*3/uL (ref 3.8–10.8)

## 2021-04-04 MED ORDER — LEVOTHYROXINE SODIUM 150 MCG PO TABS
150.0000 ug | ORAL_TABLET | Freq: Every day | ORAL | 1 refills | Status: DC
  Filled 2021-04-04: qty 90, 90d supply, fill #0

## 2021-04-07 NOTE — Telephone Encounter (Signed)
Appt is set for next week

## 2021-04-16 ENCOUNTER — Inpatient Hospital Stay (HOSPITAL_COMMUNITY): Admission: RE | Admit: 2021-04-16 | Payer: No Typology Code available for payment source | Source: Ambulatory Visit

## 2021-04-24 ENCOUNTER — Ambulatory Visit (HOSPITAL_COMMUNITY)
Admission: RE | Admit: 2021-04-24 | Discharge: 2021-04-24 | Disposition: A | Discharge status: Home / Self Care | Payer: No Typology Code available for payment source | Source: Ambulatory Visit | Attending: Nurse Practitioner | Admitting: Nurse Practitioner

## 2021-04-24 DIAGNOSIS — Z1231 Encounter for screening mammogram for malignant neoplasm of breast: Secondary | ICD-10-CM | POA: Diagnosis not present

## 2021-04-28 ENCOUNTER — Other Ambulatory Visit (HOSPITAL_COMMUNITY): Payer: Self-pay | Admitting: Nurse Practitioner

## 2021-04-28 DIAGNOSIS — R928 Other abnormal and inconclusive findings on diagnostic imaging of breast: Secondary | ICD-10-CM

## 2021-04-29 ENCOUNTER — Other Ambulatory Visit (HOSPITAL_COMMUNITY): Payer: Self-pay | Admitting: Nurse Practitioner

## 2021-04-29 ENCOUNTER — Other Ambulatory Visit: Payer: Self-pay

## 2021-04-29 ENCOUNTER — Ambulatory Visit (HOSPITAL_COMMUNITY)
Admission: RE | Admit: 2021-04-29 | Discharge: 2021-04-29 | Disposition: A | Discharge status: Home / Self Care | Payer: No Typology Code available for payment source | Source: Ambulatory Visit | Attending: Nurse Practitioner | Admitting: Nurse Practitioner

## 2021-04-29 DIAGNOSIS — R928 Other abnormal and inconclusive findings on diagnostic imaging of breast: Secondary | ICD-10-CM

## 2021-05-20 ENCOUNTER — Other Ambulatory Visit (HOSPITAL_COMMUNITY): Payer: Self-pay

## 2021-05-20 ENCOUNTER — Encounter (INDEPENDENT_AMBULATORY_CARE_PROVIDER_SITE_OTHER): Payer: Self-pay | Admitting: Internal Medicine

## 2021-05-20 ENCOUNTER — Ambulatory Visit (INDEPENDENT_AMBULATORY_CARE_PROVIDER_SITE_OTHER): Payer: No Typology Code available for payment source | Admitting: Internal Medicine

## 2021-05-20 ENCOUNTER — Other Ambulatory Visit: Payer: Self-pay

## 2021-05-20 VITALS — BP 128/66 | HR 66 | Temp 97.5°F | Resp 18 | Ht 65.0 in | Wt 198.8 lb

## 2021-05-20 DIAGNOSIS — E039 Hypothyroidism, unspecified: Secondary | ICD-10-CM | POA: Diagnosis not present

## 2021-05-20 DIAGNOSIS — E669 Obesity, unspecified: Secondary | ICD-10-CM | POA: Diagnosis not present

## 2021-05-20 MED ORDER — NP THYROID 90 MG PO TABS
90.0000 mg | ORAL_TABLET | Freq: Every day | ORAL | 3 refills | Status: DC
  Filled 2021-05-20: qty 30, 30d supply, fill #0
  Filled 2021-06-19: qty 30, 30d supply, fill #1
  Filled 2021-07-22 – 2022-03-09 (×4): qty 30, 30d supply, fill #2

## 2021-05-20 NOTE — Progress Notes (Signed)
Metrics: Intervention Frequency ACO  Documented Smoking Status Yearly  Screened one or more times in 24 months  Cessation Counseling or  Active cessation medication Past 24 months  Past 24 months   Guideline developer: UpToDate (See UpToDate for funding source) Date Released: 2014       Wellness Office Visit  Subjective:  Patient ID: Patricia Mendez, female    DOB: 1981/04/23  Age: 40 y.o. MRN: [id number]  CC: This lady comes in for follow-up of obesity and hypothyroidism. HPI  She continues to take levothyroxine.  With the higher dose, she has gained weight instead of losing weight.  Previously, she describes fatigue, mental fog. Her desires to lose weight and become healthier. Past Medical History:  Diagnosis Date   History of abnormal cervical Pap smear    Thyroid disease    hashimoto's, goiter   Past Surgical History:  Procedure Laterality Date   INTRAUTERINE DEVICE (IUD) INSERTION  07/19/2012   Mirena   THYROID SURGERY  06/25/2009   bilateral FNA     Family History  Problem Relation Age of Onset   Hypertension Mother    Diabetes Father    Hypertension Father    Heart attack Father    Multiple sclerosis Sister    Ovarian cancer Paternal Aunt    Lymphoma Paternal Grandmother    Lung cancer Paternal Grandfather     Social History   Social History Narrative   Married since 2004.Lives with husband and daughter.Clinical Oceanographer.College,Masters in Nursing ,RN previously.   Social History   Tobacco Use   Smoking status: Never   Smokeless tobacco: Never  Substance Use Topics   Alcohol use: Yes    Alcohol/week: 4.0 standard drinks    Types: 4 Cans of beer per week    Current Meds  Medication Sig   levothyroxine (SYNTHROID) 150 MCG tablet Take 1 tablet (150 mcg total) by mouth daily.   NP THYROID 90 MG tablet Take 1 tablet (90 mg total) by mouth daily.       Objective:   Today's Vitals: BP 128/66 (BP Location: Right Arm, Patient  Position: Sitting, Cuff Size: Normal)   Pulse 66   Temp (!) 97.5 F (36.4 C) (Temporal)   Resp 18   Ht 5\' 5"  (1.651 m)   Wt 198 lb 12.8 oz (90.2 kg)   BMI 33.08 kg/m  Vitals with BMI 05/20/2021 04/03/2021 08/30/2017  Height 5\' 5"  5\' 5"  5\' 5"   Weight 198 lbs 13 oz 190 lbs 10 oz 172 lbs  BMI 33.08 31.72 28.62  Systolic 128 114 90  Diastolic 66 68 60  Pulse 66 76 63     Physical Exam  She is obese and has gained 8 pounds in weight since last visit.     Assessment   1. Acquired hypothyroidism   2. Obesity (BMI 30-39.9)       Tests ordered No orders of the defined types were placed in this encounter.    Plan: 1.  I explained the difference between levothyroxine and desiccated thyroid and the rationale for using desiccated thyroid.  After shared decision-making, she is willing to try NP thyroid 90 mg daily and I have sent a prescription for this.  I explained possible side effects and how to deal with them. 2.  We also discussed nutrition once again and the importance of intermittent fasting combined with a plant-based diet.  I have given her diet sheet. 3.  I will see her in about 6  weeks time for follow-up when we will do blood work.  I spent 30 minutes with the patient discussing thyroid and nutrition today.    Meds ordered this encounter  Medications   NP THYROID 90 MG tablet    Sig: Take 1 tablet (90 mg total) by mouth daily.    Dispense:  30 tablet    Refill:  3     Lenardo Westwood Normajean Glasgow, MD

## 2021-05-20 NOTE — Patient Instructions (Signed)
[facility] Dietary Recommendations for Weight Loss What to Avoid Avoid added sugars Often added sugar can be found in processed foods such as many condiments, dry cereals, cakes, cookies, chips, crisps, crackers, candies, sweetened drinks, etc.  Read labels and AVOID/DECREASE use of foods with the following in their ingredient list: Sugar, fructose, high fructose corn syrup, sucrose, glucose, maltose, dextrose, molasses, cane sugar, brown sugar, any type of syrup, agave nectar, etc.   Avoid snacking in between meals Avoid foods made with flour If you are going to eat food made with flour, choose those made with whole-grains; and, minimize your consumption as much as is tolerable Avoid processed foods These foods are generally stocked in the middle of the grocery store. Focus on shopping on the perimeter of the grocery.  Avoid Meat  We recommend following a plant-based diet at [facility]. Thus, we recommend avoiding meat as a general rule. Consider eating beans, legumes, eggs, and/or dairy products for regular protein sources If you plan on eating meat limit to 4 ounces of meat at a time and choose lean options such as Fish, chicken, turkey. Avoid red meat intake such as pork and/or steak What to Include Vegetables GREEN LEAFY VEGETABLES: Kale, spinach, mustard greens, collard greens, cabbage, broccoli, etc. OTHER: Asparagus, cauliflower, eggplant, carrots, peas, Brussel sprouts, tomatoes, bell peppers, zucchini, beets, cucumbers, etc. Grains, seeds, and legumes Beans: kidney beans, black eyed peas, garbanzo beans, black beans, pinto beans, etc. Whole, unrefined grains: brown rice, barley, bulgur, oatmeal, etc. Healthy fats  Avoid highly processed fats such as vegetable oil Examples of healthy fats: avocado, olives, virgin olive oil, dark chocolate (?72% Cocoa), nuts (peanuts, almonds, walnuts, cashews, pecans, etc.) None to Low Intake of Animal Sources of Protein Meat  sources: chicken, turkey, salmon, tuna. Limit to 4 ounces of meat at one time. Consider limiting dairy sources, but when choosing dairy focus on: PLAIN Greek yogurt, cottage cheese, high-protein milk Fruit Choose berries  When to Eat Intermittent Fasting: Choosing not to eat for a specific time period, but DO FOCUS ON HYDRATION when fasting Multiple Techniques: Time Restricted Eating: eat 3 meals in a day, each meal lasting no more than 60 minutes, no snacks between meals 16-18 hour fast: fast for 16 to 18 hours up to 7 days a week. Often suggested to start with 2-3 nonconsecutive days per week.  Remember the time you sleep is counted as fasting.  Examples of eating schedule: Fast from 7:00pm-11:00am. Eat between 11:00am-7:00pm.  24-hour fast: fast for 24 hours up to every other day. Often suggested to start with 1 day per week Remember the time you sleep is counted as fasting Examples of eating schedule:  Eating day: eat 2-3 meals on your eating day. If doing 2 meals, each meal should last no more than 90 minutes. If doing 3 meals, each meal should last no more than 60 minutes. Finish last meal by 7:00pm. Fasting day: Fast until 7:00pm.  IF YOU FEEL UNWELL FOR ANY REASON/IN ANY WAY WHEN FASTING, STOP FASTING BY EATING A NUTRITIOUS SNACK OR LIGHT MEAL ALWAYS FOCUS ON HYDRATION DURING FASTS Acceptable Hydration sources: water, broths, tea/coffee (black tea/coffee is best but using a small amount of whole-fat dairy products in coffee/tea is acceptable).  Poor Hydration Sources: anything with sugar or artificial sweeteners added to it  These recommendations have been developed for patients that are actively receiving medical care from either Dr. Tywone Bembenek or Sarah Gray, DNP, NP-C at [facility]. These recommendations   are developed for patients with specific medical conditions and are not meant to be distributed or used by others that are not actively receiving care from either provider  listed above at [facility]. It is not appropriate to participate in the above eating plans without proper medical supervision.   Reference: Fung, J. The obesity code. [place]: [place]; 2016.   

## 2021-05-26 ENCOUNTER — Other Ambulatory Visit: Payer: Self-pay

## 2021-05-26 ENCOUNTER — Encounter: Payer: Self-pay | Admitting: *Deleted

## 2021-05-26 ENCOUNTER — Emergency Department: Payer: No Typology Code available for payment source

## 2021-05-26 ENCOUNTER — Emergency Department
Admission: EM | Admit: 2021-05-26 | Discharge: 2021-05-26 | Disposition: A | Discharge status: Home / Self Care | Payer: No Typology Code available for payment source | Attending: Emergency Medicine | Admitting: Emergency Medicine

## 2021-05-26 DIAGNOSIS — W2107XA Struck by softball, initial encounter: Secondary | ICD-10-CM | POA: Insufficient documentation

## 2021-05-26 DIAGNOSIS — S022XXA Fracture of nasal bones, initial encounter for closed fracture: Secondary | ICD-10-CM | POA: Insufficient documentation

## 2021-05-26 DIAGNOSIS — Z79899 Other long term (current) drug therapy: Secondary | ICD-10-CM | POA: Insufficient documentation

## 2021-05-26 DIAGNOSIS — S0990XA Unspecified injury of head, initial encounter: Secondary | ICD-10-CM | POA: Diagnosis present

## 2021-05-26 DIAGNOSIS — S060XAA Concussion with loss of consciousness status unknown, initial encounter: Secondary | ICD-10-CM

## 2021-05-26 DIAGNOSIS — Y9364 Activity, baseball: Secondary | ICD-10-CM | POA: Insufficient documentation

## 2021-05-26 DIAGNOSIS — S060X1A Concussion with loss of consciousness of 30 minutes or less, initial encounter: Secondary | ICD-10-CM | POA: Insufficient documentation

## 2021-05-26 DIAGNOSIS — E039 Hypothyroidism, unspecified: Secondary | ICD-10-CM | POA: Insufficient documentation

## 2021-05-26 HISTORY — DX: Concussion with loss of consciousness status unknown, initial encounter: S06.0XAA

## 2021-05-26 MED ORDER — AMOXICILLIN-POT CLAVULANATE 875-125 MG PO TABS: 1.0000 | ORAL_TABLET | Freq: Two times a day (BID) | ORAL | 0 refills | Status: AC

## 2021-05-26 MED ORDER — OXYCODONE-ACETAMINOPHEN 5-325 MG PO TABS: 1.0000 | ORAL_TABLET | Freq: Three times a day (TID) | ORAL | 0 refills | Status: DC | PRN

## 2021-05-26 NOTE — Discharge Instructions (Addendum)
Please seek medical attention for any high fevers, chest pain, shortness of breath, change in behavior, persistent vomiting, bloody stool or any other new or concerning symptoms.  

## 2021-05-26 NOTE — ED Provider Notes (Signed)
[facility] Emergency Department Provider Note   ____________________________________________   I have reviewed the triage vital signs and the nursing notes.   HISTORY  Chief Complaint Facial Injury   History limited by: Not limited   HPI Patricia Mendez is a 40 y.o. female who presents to the emergency department today because of concern for facial injury. Patient was out practicing softball with her daughter when she pitched to her daughter who hit the ball and it hit the patients face. She states she did black out. When she woke up she noticed a large volume of blood coming from her nose. She is having pain to her nose and face. She denies any bleeding disorder.    Records reviewed. Per medical record review patient has a history of hypothyroid.   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 Authorizing Provider  levonorgestrel (MIRENA) 20 MCG/24HR IUD 1 Intra Uterine Device (1 each total) by Intrauterine route once. 07/29/17 07/29/17  Farrel Conners, CNM  levothyroxine (SYNTHROID) 150 MCG tablet Take 1 tablet (150 mcg total) by mouth daily. 04/04/21   Elenore Paddy, NP  NP THYROID 90 MG tablet Take 1 tablet (90 mg total) by mouth  daily. 05/20/21   Wilson Singer, MD    Allergies Patient has no known allergies.  Family History  Problem Relation Age of Onset   Hypertension Mother    Diabetes Father    Hypertension Father    Heart attack Father    Multiple sclerosis Sister    Ovarian cancer Paternal Aunt    Lymphoma Paternal Grandmother    Lung cancer Paternal Grandfather     Social History Social History   Tobacco Use   Smoking status: Never   Smokeless tobacco: Never  Vaping Use   Vaping Use: Never used  Substance Use Topics   Alcohol use: Yes    Alcohol/week: 4.0 standard drinks    Types: 4 Cans of beer per week   Drug use: No    Review of Systems Constitutional: No fever/chills Eyes: No visual changes. ENT: Positive for bloody nose.  Cardiovascular: Denies chest pain. Respiratory: Denies shortness of breath. Gastrointestinal: No abdominal pain.  No nausea, no vomiting.  No diarrhea.   Genitourinary: Negative for dysuria. Musculoskeletal: Negative for back pain. Skin: Negative for rash. Neurological: Negative for headaches, focal weakness or numbness.  ____________________________________________   PHYSICAL EXAM:  VITAL SIGNS: ED Triage Vitals  Enc Vitals Group     BP 05/26/21 2023 103/74     Pulse Rate 05/26/21 2023 90     Resp 05/26/21 2023 20     Temp 05/26/21 2023 98.2 F (36.8 C)     Temp Source 05/26/21 2023 Oral  SpO2 05/26/21 2023 98 %     Weight --      Height --      Head Circumference --      Peak Flow --      Pain Score 05/26/21 2022 8   Constitutional: Alert and oriented.  Eyes: Conjunctivae are normal.  ENT      Head: Normocephalic and atraumatic.      Nose: Bruising. No septal hematoma. Dried blood in right nares.       Mouth/Throat: Mucous membranes are moist.      Neck: No stridor. Hematological/Lymphatic/Immunilogical: No cervical lymphadenopathy. Cardiovascular: Normal rate, regular rhythm.  No murmurs, rubs, or gallops.  Respiratory: Normal  respiratory effort without tachypnea nor retractions. Breath sounds are clear and equal bilaterally. No wheezes/rales/rhonchi. Gastrointestinal: Soft and non tender. No rebound. No guarding.  Genitourinary: Deferred Musculoskeletal: Normal range of motion in all extremities. No lower extremity edema. Neurologic:  Normal speech and language. No gross focal neurologic deficits are appreciated.  Skin:  Skin is warm, dry and intact. No rash noted. Psychiatric: Mood and affect are normal. Speech and behavior are normal. Patient exhibits appropriate insight and judgment.  ____________________________________________    LABS (pertinent positives/negatives)  None  ____________________________________________   EKG  None  ____________________________________________    RADIOLOGY  CT head/cervical spine No acute intracranial traumatic injury. Nasal bone fracture.    ____________________________________________   PROCEDURES  Procedures  ____________________________________________   INITIAL IMPRESSION / ASSESSMENT AND PLAN / ED COURSE  Pertinent labs & imaging results that were available during my care of the patient were reviewed by me and considered in my medical decision making (see chart for details).   Patient presents to the emergency department after being hit in the face with a softball. On exam patients nose is swollen, however no septal hematomas. CTs were performed which did not show any acute intracranial abnormality. Did confirm nasal bone fractures. Discussed possible concussion with patient and nasal fracture. Will give ENT follow up.   ____________________________________________   FINAL CLINICAL IMPRESSION(S) / ED DIAGNOSES  Final diagnoses:  Concussion with loss of consciousness of 30 minutes or less, initial encounter  Closed fracture of nasal bone, initial encounter     Note: This dictation was prepared with Dragon dictation. Any transcriptional  errors that result from this process are unintentional     Phineas Semen, MD 05/26/21 2310

## 2021-05-26 NOTE — ED Triage Notes (Signed)
Pt was struck by a softball in the face (nose), with obvious swelling to the nose. Bleeding now subsided. Pt reports she did have LOC. ETOH today.  No blood thinners.

## 2021-05-28 ENCOUNTER — Other Ambulatory Visit (INDEPENDENT_AMBULATORY_CARE_PROVIDER_SITE_OTHER): Payer: Self-pay | Admitting: Nurse Practitioner

## 2021-05-28 ENCOUNTER — Encounter (INDEPENDENT_AMBULATORY_CARE_PROVIDER_SITE_OTHER): Payer: Self-pay | Admitting: Nurse Practitioner

## 2021-05-28 DIAGNOSIS — S022XXA Fracture of nasal bones, initial encounter for closed fracture: Secondary | ICD-10-CM

## 2021-05-28 NOTE — Progress Notes (Signed)
Just FYI ordering ENT referral for broke nose for this patient. She is requesting to see Dr. Aleen Campi

## 2021-05-28 NOTE — Progress Notes (Signed)
Sent referral to Dr Crosby Oyster office now. They will contact the patient.

## 2021-06-19 ENCOUNTER — Other Ambulatory Visit (HOSPITAL_COMMUNITY): Payer: Self-pay

## 2021-06-25 ENCOUNTER — Encounter (INDEPENDENT_AMBULATORY_CARE_PROVIDER_SITE_OTHER): Payer: Self-pay | Admitting: Nurse Practitioner

## 2021-07-09 ENCOUNTER — Ambulatory Visit (INDEPENDENT_AMBULATORY_CARE_PROVIDER_SITE_OTHER): Payer: No Typology Code available for payment source | Admitting: Internal Medicine

## 2021-07-21 ENCOUNTER — Telehealth: Payer: No Typology Code available for payment source | Admitting: Physician Assistant

## 2021-07-21 DIAGNOSIS — J019 Acute sinusitis, unspecified: Secondary | ICD-10-CM | POA: Diagnosis not present

## 2021-07-21 DIAGNOSIS — B9789 Other viral agents as the cause of diseases classified elsewhere: Secondary | ICD-10-CM | POA: Diagnosis not present

## 2021-07-21 MED ORDER — IPRATROPIUM BROMIDE 0.03 % NA SOLN: 2.0000 | Freq: Two times a day (BID) | NASAL | 0 refills | Status: DC

## 2021-07-21 NOTE — Progress Notes (Signed)

## 2021-07-21 NOTE — Progress Notes (Signed)
I have spent 5 minutes in review of e-visit questionnaire, review and updating patient chart, medical decision making and response to patient.   Ariana Cavenaugh Cody Darlis Wragg, PA-C    

## 2021-07-22 ENCOUNTER — Other Ambulatory Visit (HOSPITAL_COMMUNITY): Payer: Self-pay

## 2021-07-30 ENCOUNTER — Other Ambulatory Visit (HOSPITAL_COMMUNITY): Payer: Self-pay

## 2021-07-31 ENCOUNTER — Telehealth: Payer: No Typology Code available for payment source | Admitting: Physician Assistant

## 2021-07-31 DIAGNOSIS — B001 Herpesviral vesicular dermatitis: Secondary | ICD-10-CM | POA: Diagnosis not present

## 2021-07-31 MED ORDER — VALACYCLOVIR HCL 1 G PO TABS: 2000.0000 mg | ORAL_TABLET | Freq: Two times a day (BID) | ORAL | 0 refills | Status: AC

## 2021-07-31 NOTE — Progress Notes (Signed)
I have spent 5 minutes in review of e-visit questionnaire, review and updating patient chart, medical decision making and response to patient.   Azarah Dacy Cody Edman Lipsey, PA-C    

## 2021-07-31 NOTE — Progress Notes (Signed)

## 2021-08-07 ENCOUNTER — Other Ambulatory Visit (HOSPITAL_COMMUNITY): Payer: Self-pay

## 2021-08-07 MED ORDER — NP THYROID 90 MG PO TABS
ORAL_TABLET | ORAL | 2 refills | Status: DC
  Filled 2021-08-07: qty 90, 90d supply, fill #0
  Filled 2021-11-12: qty 90, 90d supply, fill #1
  Filled 2022-03-09: qty 90, 90d supply, fill #2

## 2021-08-07 MED ORDER — ONDANSETRON 4 MG PO TBDP
ORAL_TABLET | ORAL | 2 refills | Status: DC
  Filled 2021-08-07: qty 30, 10d supply, fill #0

## 2021-08-21 ENCOUNTER — Other Ambulatory Visit (HOSPITAL_COMMUNITY): Payer: Self-pay

## 2021-08-21 MED ORDER — OZEMPIC (0.25 OR 0.5 MG/DOSE) 2 MG/1.5ML ~~LOC~~ SOPN
PEN_INJECTOR | SUBCUTANEOUS | 1 refills | Status: DC
  Filled 2021-08-21: qty 1.5, 28d supply, fill #0
  Filled 2021-09-18: qty 1.5, 28d supply, fill #1

## 2021-08-21 MED ORDER — OZEMPIC (0.25 OR 0.5 MG/DOSE) 2 MG/1.5ML ~~LOC~~ SOPN: PEN_INJECTOR | SUBCUTANEOUS | 1 refills | Status: DC

## 2021-09-18 ENCOUNTER — Other Ambulatory Visit (HOSPITAL_COMMUNITY): Payer: Self-pay

## 2021-10-29 ENCOUNTER — Other Ambulatory Visit (HOSPITAL_COMMUNITY)
Admission: RE | Admit: 2021-10-29 | Discharge: 2021-10-29 | Disposition: A | Discharge status: Home / Self Care | Payer: No Typology Code available for payment source | Source: Ambulatory Visit | Attending: Obstetrics & Gynecology | Admitting: Obstetrics & Gynecology

## 2021-10-29 ENCOUNTER — Other Ambulatory Visit: Payer: Self-pay

## 2021-10-29 ENCOUNTER — Encounter: Payer: Self-pay | Admitting: Obstetrics & Gynecology

## 2021-10-29 ENCOUNTER — Ambulatory Visit (INDEPENDENT_AMBULATORY_CARE_PROVIDER_SITE_OTHER): Payer: No Typology Code available for payment source | Admitting: Obstetrics & Gynecology

## 2021-10-29 VITALS — BP 112/78 | HR 87 | Ht 65.0 in | Wt 190.0 lb

## 2021-10-29 DIAGNOSIS — R102 Pelvic and perineal pain: Secondary | ICD-10-CM | POA: Diagnosis not present

## 2021-10-29 DIAGNOSIS — R87610 Atypical squamous cells of undetermined significance on cytologic smear of cervix (ASC-US): Secondary | ICD-10-CM

## 2021-10-29 DIAGNOSIS — Z975 Presence of (intrauterine) contraceptive device: Secondary | ICD-10-CM | POA: Diagnosis not present

## 2021-10-29 DIAGNOSIS — Z01419 Encounter for gynecological examination (general) (routine) without abnormal findings: Secondary | ICD-10-CM | POA: Diagnosis present

## 2021-10-29 HISTORY — DX: Presence of (intrauterine) contraceptive device: Z97.5

## 2021-10-29 HISTORY — DX: Atypical squamous cells of undetermined significance on cytologic smear of cervix (ASC-US): R87.610

## 2021-10-29 NOTE — Progress Notes (Signed)
GYNECOLOGY ANNUAL PREVENTATIVE CARE ENCOUNTER NOTE  History:     Patricia Mendez is a 40 y.o. G92P1001 female here for a routine annual gynecologic exam. Was a patient of [facility] in the past, has Mirena IUD placed in 07/2017.  Current complaints: feels her metabolism has slowed, seeing [facility] who did labs and her DHEAS was elevated concerning for possible PCOS.  No irregular periods or signs of testosterone excess, but she has IUD in place. She is getting Ozempic injections to help with weight loss, wants to know if there is anything else she could do.  Also reports period left sided pelvic pain for over a year, worried about ovarian cyst. This is worse during her ovulation time, she reports she can tell she is still ovulating with the IUD in place.  Wanted to discuss how long to keep IUD in place also.    Denies current abnormal vaginal bleeding, discharge, pelvic pain, problems with intercourse or other gynecologic concerns.    Gynecologic History No LMP recorded. (Menstrual status: IUD). Contraception: Mirena IUD since 07/2017 Last Pap: 05/12/2017. Result was normal with positive HPV Last Mammogram: 04/29/2021.  Result was normal  Obstetric History OB History  Gravida Para Term Preterm AB Living  1 1 1     1   SAB IAB Ectopic Multiple Live Births          1    # Outcome Date GA Lbr Len/2nd Weight Sex Delivery Anes PTL Lv  1 Term 05/31/07   8 lb 15 oz (4.054 kg)  Vag-Spont   LIV    Past Medical History:  Diagnosis Date   History of abnormal cervical Pap smear    Thyroid disease    hashimoto's, goiter    Past Surgical History:  Procedure Laterality Date   INTRAUTERINE DEVICE (IUD) INSERTION  07/19/2012   Mirena   THYROID SURGERY  06/25/2009   bilateral FNA    Current Outpatient Medications on File Prior to Visit  Medication Sig Dispense Refill   NP THYROID 90 MG tablet Take 1 tablet (90 mg total) by mouth daily. 30 tablet 3   ondansetron  (ZOFRAN-ODT) 4 MG disintegrating tablet Dissolve 1 tablet by mouth every 8 hours 30 tablet 2   Semaglutide,0.25 or 0.5MG /DOS, (OZEMPIC, 0.25 OR 0.5 MG/DOSE,) 2 MG/1.5ML SOPN Inject 0.25 mg every week by subcutaneous route for 28 days. 1.5 mL 1   levonorgestrel (MIRENA) 20 MCG/24HR IUD 1 Intra Uterine Device (1 each total) by Intrauterine route once. 1 each 0   No current facility-administered medications on file prior to visit.    No Known Allergies  Social History:  reports that she has never smoked. She has never used smokeless tobacco. She reports current alcohol use of about 4.0 standard drinks per week. She reports that she does not use drugs.  Family History  Problem Relation Age of Onset   Hypertension Mother    Diabetes Father    Hypertension Father    Heart attack Father    Multiple sclerosis Sister    Ovarian cancer Paternal Aunt    Lymphoma Paternal Grandmother    Lung cancer Paternal Grandfather     The following portions of the patient's history were reviewed and updated as appropriate: allergies, current medications, past family history, past medical history, past social history, past surgical history and problem list.  Review of Systems Pertinent items noted in HPI and remainder of comprehensive ROS otherwise negative.  Physical Exam:  BP  112/78   Pulse 87   Ht 5\' 5"  (1.651 m)   Wt 190 lb (86.2 kg)   BMI 31.62 kg/m  CONSTITUTIONAL: Well-developed, well-nourished female in no acute distress.  HENT:  Normocephalic, atraumatic, External right and left ear normal.  EYES: Conjunctivae and EOM are normal. Pupils are equal, round, and reactive to light. No scleral icterus.  NECK: Normal range of motion, supple, no masses.  Normal thyroid.  SKIN: Skin is warm and dry. No rash noted. Not diaphoretic. No erythema. No pallor. MUSCULOSKELETAL: Normal range of motion. No tenderness.  No cyanosis, clubbing, or edema. NEUROLOGIC: Alert and oriented to person, place, and time.  Normal reflexes, muscle tone coordination.  PSYCHIATRIC: Normal mood and affect. Normal behavior. Normal judgment and thought content. CARDIOVASCULAR: Normal heart rate noted, regular rhythm RESPIRATORY: Clear to auscultation bilaterally. Effort and breath sounds normal, no problems with respiration noted. BREASTS: Symmetric in size. No masses, tenderness, skin changes, nipple drainage, or lymphadenopathy bilaterally. Performed in the presence of a chaperone. ABDOMEN: Soft, no distention noted.  Mild LLQ tenderness, no other tenderness, rebound or guarding.  PELVIC: Normal appearing external genitalia and urethral meatus; normal appearing vaginal mucosa and cervix.  Mirena strings visualized. No abnormal vaginal discharge noted.  Pap smear obtained.  Normal uterine size, no other palpable masses, no uterine or adnexal tenderness.  Performed in the presence of a chaperone.   Assessment and Plan:     1. Pelvic pain Will follow up results of ultrasound and manage accordingly. - PELVIC COMPLETE WITH TRANSVAGINAL; Future  2. Mirena IUD (intrauterine device) in place since 07/2017 No current issues. Patient informed this can be in place until 07/2025.  3. Well woman exam with routine gynecological exam - Cytology - PAP Will follow up results of pap smear and manage accordingly. Mammogram is up to date. Discussed that metabolism can slow down with age, perimenopause.  Does not meet criteria for PCOS yet, but she is already doing what would be recommended if she had the diagnosis (diet, exercise, weight loss, Ozempic etc).  No other intervention needed. Patient was reassured. Routine preventative health maintenance measures emphasized. Please refer to After Visit Summary for other counseling recommendations.      08/2025, MD, FACOG Obstetrician & Gynecologist, [facility] for [facility]

## 2021-11-03 ENCOUNTER — Encounter: Payer: Self-pay | Admitting: Obstetrics & Gynecology

## 2021-11-03 ENCOUNTER — Ambulatory Visit
Admission: RE | Admit: 2021-11-03 | Discharge: 2021-11-03 | Disposition: A | Discharge status: Home / Self Care | Payer: No Typology Code available for payment source | Source: Ambulatory Visit | Attending: Obstetrics & Gynecology | Admitting: Obstetrics & Gynecology

## 2021-11-03 DIAGNOSIS — Z975 Presence of (intrauterine) contraceptive device: Secondary | ICD-10-CM | POA: Diagnosis present

## 2021-11-03 DIAGNOSIS — R102 Pelvic and perineal pain: Secondary | ICD-10-CM | POA: Diagnosis not present

## 2021-11-03 LAB — CYTOLOGY - PAP
Comment: NEGATIVE
Diagnosis: UNDETERMINED — AB
High risk HPV: NEGATIVE

## 2021-11-05 ENCOUNTER — Other Ambulatory Visit (HOSPITAL_COMMUNITY): Payer: Self-pay

## 2021-11-05 MED ORDER — OZEMPIC (1 MG/DOSE) 4 MG/3ML ~~LOC~~ SOPN
PEN_INJECTOR | SUBCUTANEOUS | 3 refills | Status: DC
  Filled 2021-11-05: qty 3, 28d supply, fill #0
  Filled 2021-11-27: qty 3, 28d supply, fill #1
  Filled 2021-12-29: qty 3, 28d supply, fill #2
  Filled 2022-01-22: qty 3, 28d supply, fill #3

## 2021-11-11 ENCOUNTER — Other Ambulatory Visit (HOSPITAL_COMMUNITY): Payer: Self-pay

## 2021-11-12 ENCOUNTER — Other Ambulatory Visit (HOSPITAL_COMMUNITY): Payer: Self-pay

## 2021-11-13 ENCOUNTER — Other Ambulatory Visit (HOSPITAL_COMMUNITY): Payer: Self-pay

## 2021-11-23 DIAGNOSIS — N393 Stress incontinence (female) (male): Secondary | ICD-10-CM

## 2021-11-23 HISTORY — DX: Stress incontinence (female) (male): N39.3

## 2021-11-27 ENCOUNTER — Other Ambulatory Visit (HOSPITAL_COMMUNITY): Payer: Self-pay

## 2021-12-24 IMAGING — CT CT MAXILLOFACIAL W/O CM
3 of 4 series · 15 of 47 positions shown, 18 images · non-contrast
Comparison: None.

CLINICAL DATA: Struck by softball to face/nose

EXAM:
CT HEAD WITHOUT CONTRAST
CT MAXILLOFACIAL WITHOUT CONTRAST
TECHNIQUE: Multidetector CT imaging of the head and maxillofacial structures
were performed using the standard protocol without intravenous
contrast. Multiplanar CT image reconstructions of the maxillofacial
structures were also generated.

[Series 2: max soft · axial · 0.31mm/px · z∈[-206,-70]mm · 10 of 80 slices shown, 13 images]
[im 6/80  brain]
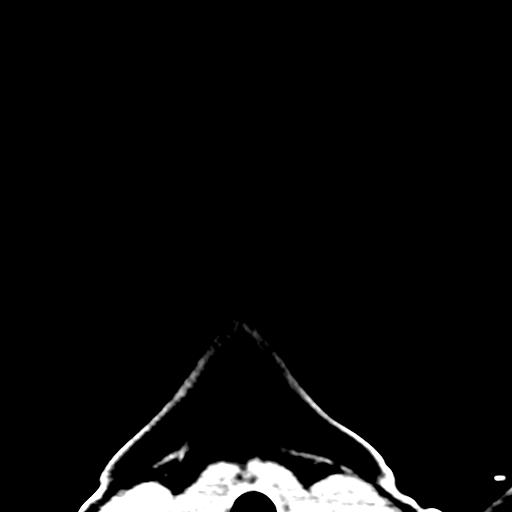
[im 6/80  bone]
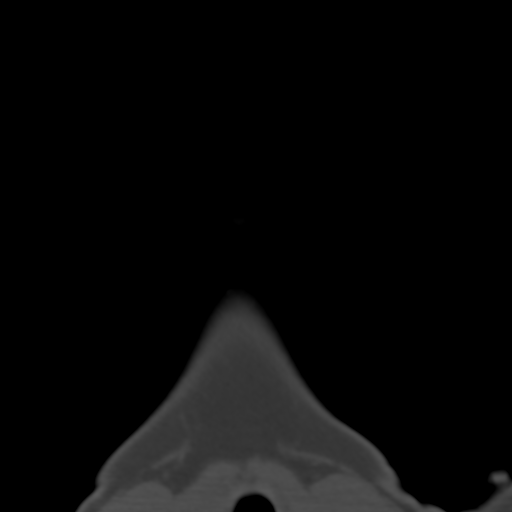
[im 14/80  bone]
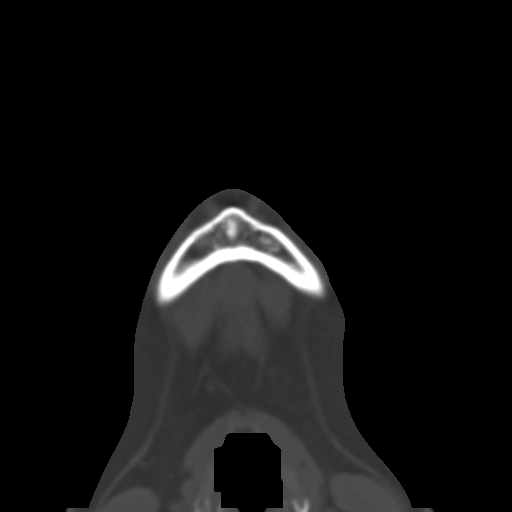
[im 22/80  bone]
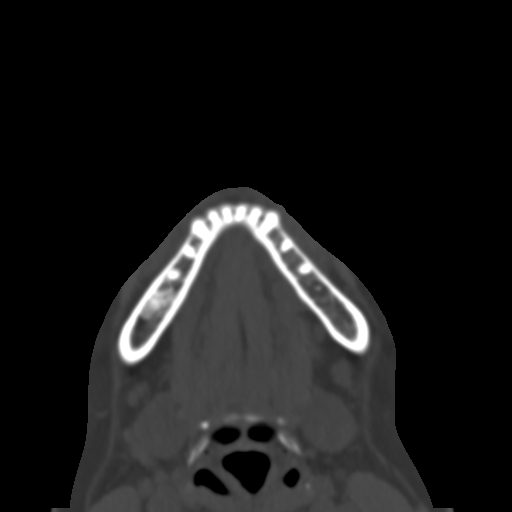
[im 28/80  bone]
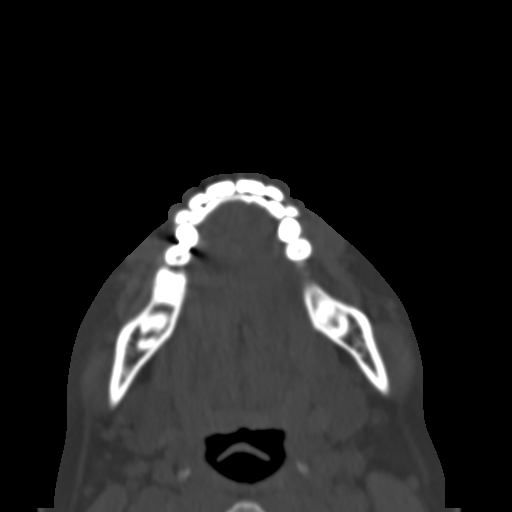
[im 36/80  brain]
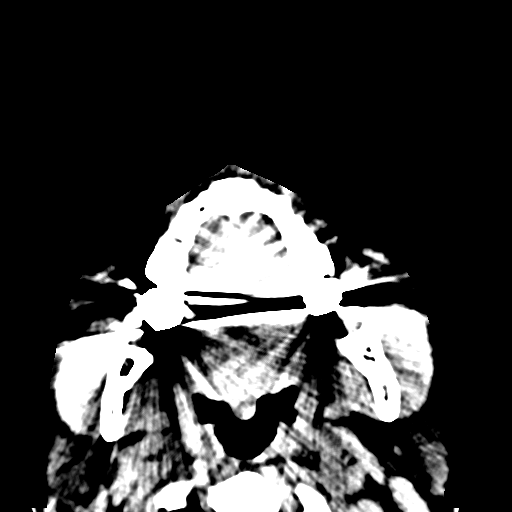
[im 36/80  bone]
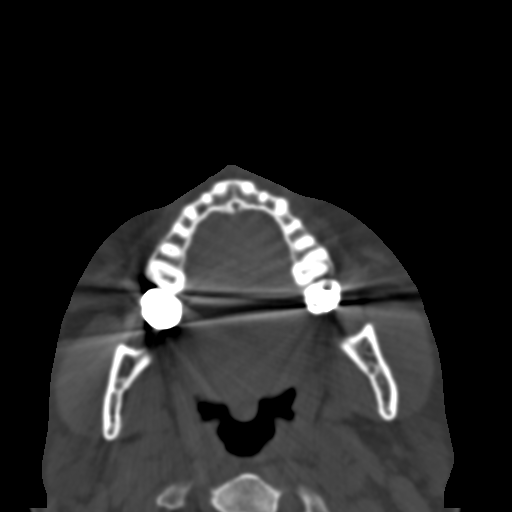
[im 44/80  bone]
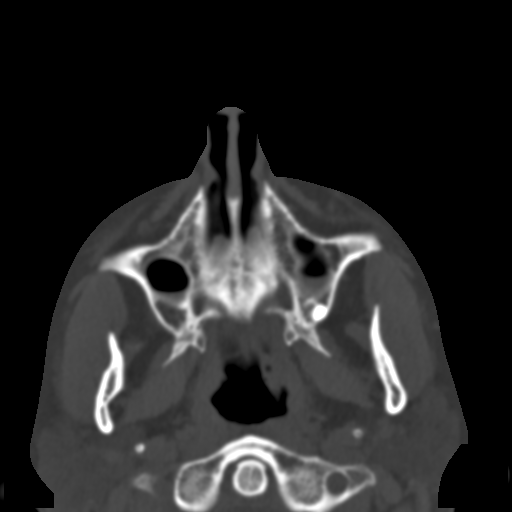
[im 52/80  bone]
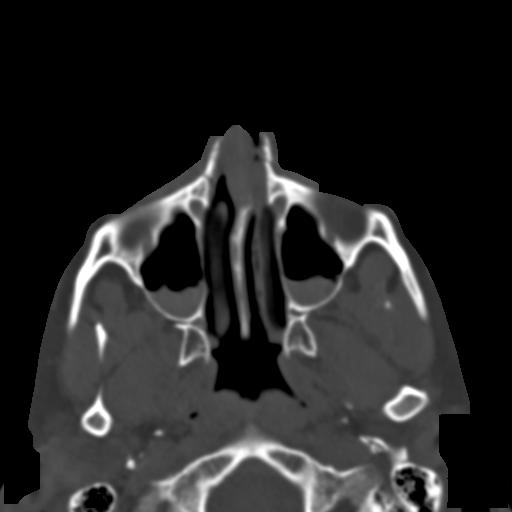
[im 60/80  bone]
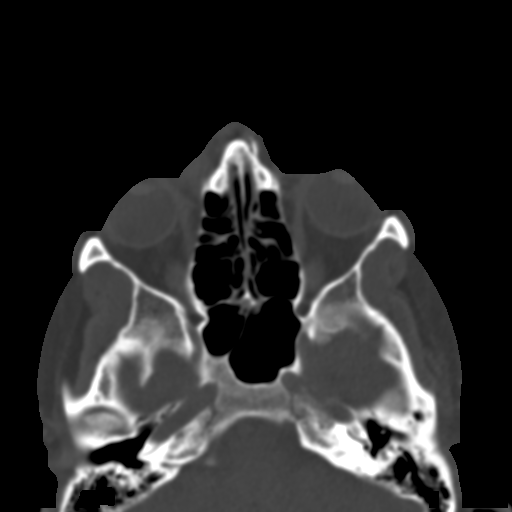
[im 66/80  brain]
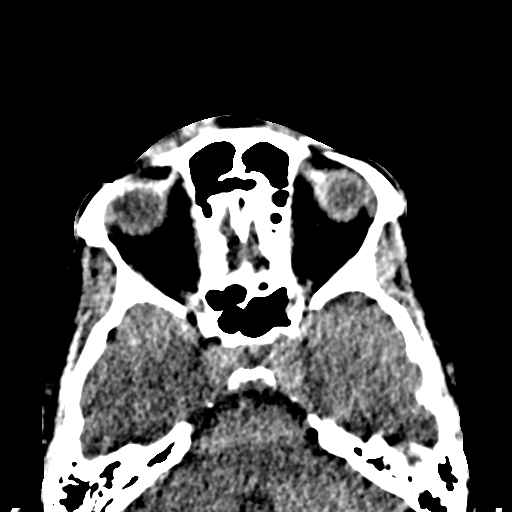
[im 66/80  bone]
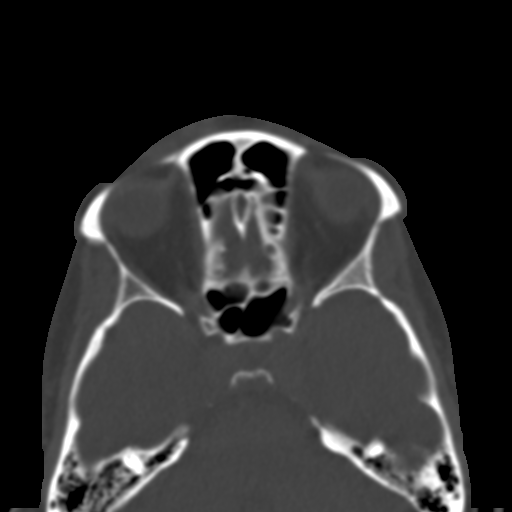
[im 74/80  bone]
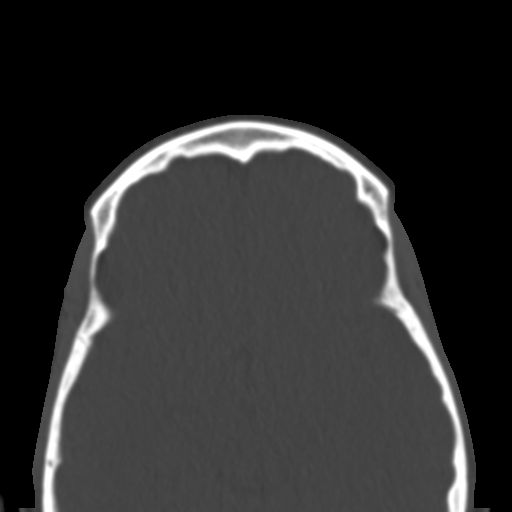

[Series 6: coronal soft · coronal · 0.31mm/px · 3 of 84 slices shown]
[im 28/84  bone]
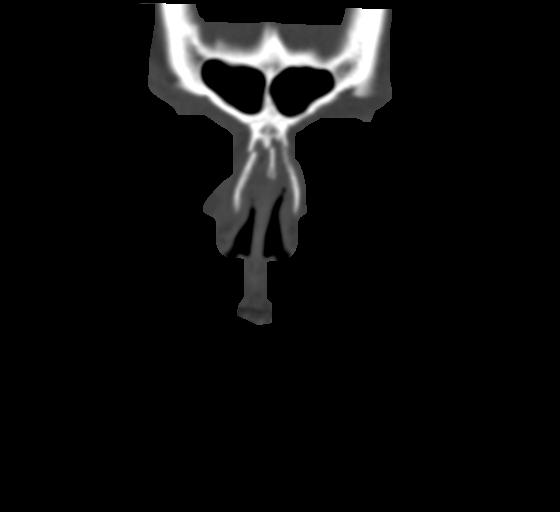
[im 37/84  bone]
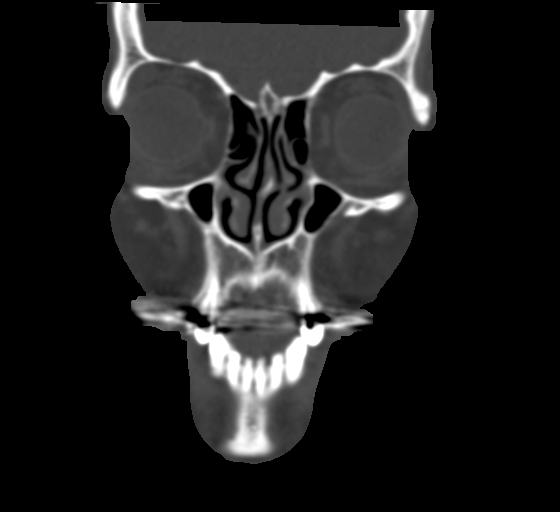
[im 47/84  bone]
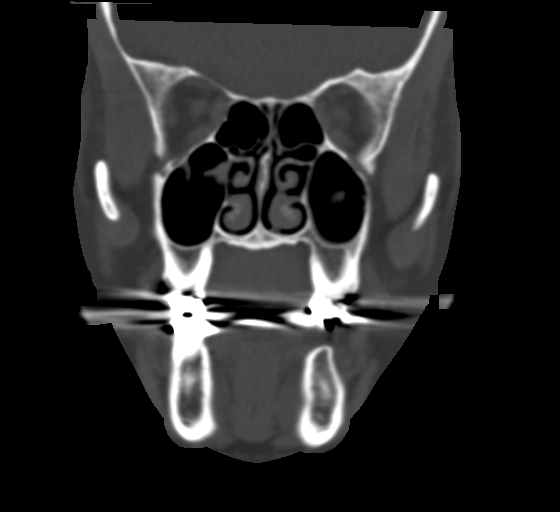

[Series 9: sagittal bone · sagittal · 0.28mm/px · 2 of 90 slices shown]
[im 30/90  bone]
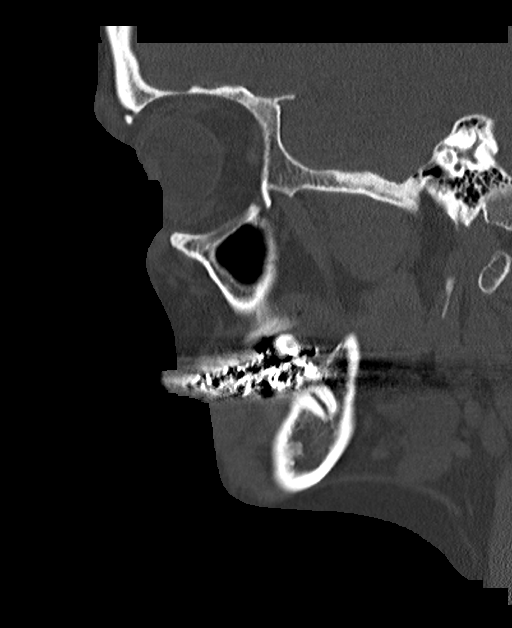
[im 60/90  bone]
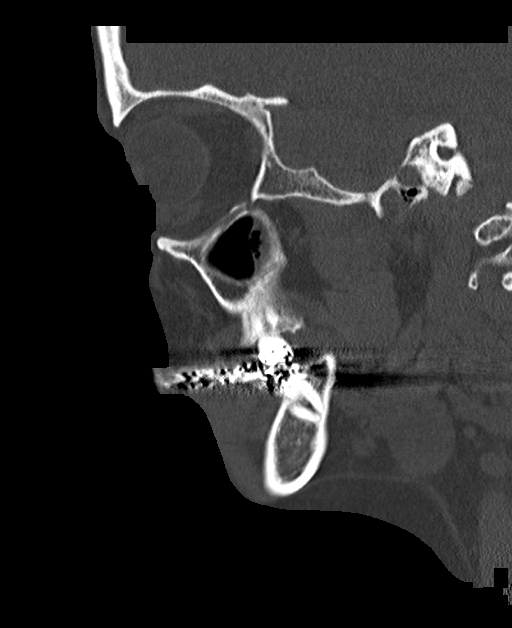

[15 of 47 positions shown; findings below may reference images not displayed]

FINDINGS: CT HEAD FINDINGS

Brain: No evidence of acute infarction, hemorrhage, hydrocephalus,
extra-axial collection or mass lesion/mass effect.

Vascular: No hyperdense vessel or unexpected calcification.

Skull: Normal. Negative for fracture or focal lesion.

Other: None.

CT MAXILLOFACIAL FINDINGS

Osseous: Nondisplaced bilateral nasal bone fractures (series 3/image
26). Overlying soft tissue swelling. Nasal septum is midline,
without evidence of nasal septal hematoma.

Otherwise, no evidence of maxillofacial fracture.

Mandible is intact. Bilateral mandibular condyles are well-seated in
the TMJs.

Orbits: Bilateral orbits, including the globes and retroconal soft
tissues, are within normal limits.

Sinuses: Mild layering fluid in the bilateral maxillary sinuses.
Visualized paranasal sinuses and mastoid air cells are otherwise
clear.

Soft tissues: Soft tissue swelling overlying the right frontal bone
(series 2/image 15) and right greater than left nasal bridge (series
2/image 26).
IMPRESSION: Nondisplaced bilateral nasal bone fractures. Soft tissue swelling
overlying the right frontal bone and bilateral nasal bridge.

Normal head CT.

## 2021-12-29 ENCOUNTER — Other Ambulatory Visit (HOSPITAL_COMMUNITY): Payer: Self-pay

## 2022-01-22 ENCOUNTER — Other Ambulatory Visit (HOSPITAL_COMMUNITY): Payer: Self-pay

## 2022-03-09 ENCOUNTER — Other Ambulatory Visit (HOSPITAL_COMMUNITY): Payer: Self-pay

## 2022-05-09 ENCOUNTER — Telehealth: Payer: No Typology Code available for payment source | Admitting: Nurse Practitioner

## 2022-05-09 DIAGNOSIS — B001 Herpesviral vesicular dermatitis: Secondary | ICD-10-CM | POA: Diagnosis not present

## 2022-05-09 MED ORDER — VALACYCLOVIR HCL 1 G PO TABS: 2000.0000 mg | ORAL_TABLET | Freq: Two times a day (BID) | ORAL | 1 refills | Status: AC

## 2022-05-09 NOTE — Progress Notes (Signed)
I have spent 5 minutes in review of e-visit questionnaire, review and updating patient chart, medical decision making and response to patient.  ° °Ranny Wiebelhaus W Lisabeth Mian, NP ° °  °

## 2022-05-09 NOTE — Progress Notes (Signed)

## 2022-05-12 NOTE — Progress Notes (Signed)
New patient visit   Patient: Patricia Mendez   DOB: 16-Dec-1980   42 y.o. Female  MRN: [id number] Visit Date: 05/15/2022  Today's healthcare provider: Jacky Kindle, FNP  Patient presents for new patient visit to establish care.  Introduced to Publishing rights manager role and practice setting.  All questions answered.  Discussed provider/patient relationship and expectations.   I,Tiffany J Bragg,acting as a scribe for Jacky Kindle, FNP.,have documented all relevant documentation on the behalf of Jacky Kindle, FNP,as directed by  Jacky Kindle, FNP while in the presence of Jacky Kindle, FNP.   Chief Complaint  Patient presents with   Establish Care    Patient wants to have labs done and see why she gained weight recently. No other issues she wants to discuss at this time.    Hypothyroidism   Subjective    Patricia Mendez is a 41 y.o. female who presents today as a new patient to establish care.  HPI HPI     Establish Care    Additional comments: Patient wants to have labs done and see why she gained weight recently. No other issues she wants to discuss at this time.       Last edited by Marlana Salvage, CMA on 05/15/2022  9:17 AM.       Past Medical History:  Diagnosis Date   ASCUS of cervix with negative high risk HPV on 10/29/2021 10/29/2021   History of abnormal cervical Pap smear    Thyroid disease    hashimoto's, goiter   Past Surgical History:  Procedure Laterality Date   INTRAUTERINE DEVICE (IUD) INSERTION  07/19/2012   Mirena   THYROID SURGERY  06/25/2009   bilateral FNA   Family Status  Relation Name Status   Mother  Alive, age 92y   Father  Alive, age 72y   Sister  Alive   Brother  Alive   Daughter Chloe 13 Alive   Oceanographer  (Not Specified)   PGM  (Not Specified)   PGF  (Not Specified)   Family History  Problem Relation Age of Onset   Hypertension Mother    Diabetes Father    Hypertension Father    Heart attack Father    Multiple sclerosis  Sister    Ovarian cancer Paternal Aunt    Lymphoma Paternal Grandmother    Lung cancer Paternal Grandfather    Social History   Socioeconomic History   Marital status: Married    Spouse name: Not on file   Number of children: 1   Years of education: Not on file   Highest education level: Not on file  Occupational History   Occupation: IT trainer  Tobacco Use   Smoking status: Never   Smokeless tobacco: Never  Vaping Use   Vaping Use: Never used  Substance and Sexual Activity   Alcohol use: Yes    Alcohol/week: 4.0 standard drinks of alcohol    Types: 4 Cans of beer per week   Drug use: No   Sexual activity: Yes    Partners: Male    Birth control/protection: I.U.D.  Other Topics Concern   Not on file  Social History Narrative   Married since 2004.Lives with husband and daughter.Clinical Oceanographer.College,Masters in Nursing ,RN previously.   Social Determinants of Health   Financial Resource Strain: Not on file  Food Insecurity: Not on file  Transportation Needs: Not on file  Physical Activity: Not on file  Stress:  Not on file  Social Connections: Not on file   Outpatient Medications Prior to Visit  Medication Sig   thyroid (NP THYROID) 90 MG tablet Take 1 tablet by mouth every day   levonorgestrel (MIRENA) 20 MCG/24HR IUD 1 Intra Uterine Device (1 each total) by Intrauterine route once.   [DISCONTINUED] NP THYROID 90 MG tablet Take 1 tablet (90 mg total) by mouth daily.   [DISCONTINUED] ondansetron (ZOFRAN-ODT) 4 MG disintegrating tablet Dissolve 1 tablet by mouth every 8 hours   [DISCONTINUED] Semaglutide, 1 MG/DOSE, (OZEMPIC, 1 MG/DOSE,) 4 MG/3ML SOPN Inject 1 mg under the skin every week as directed.   [DISCONTINUED] Semaglutide,0.25 or 0.5MG /DOS, (OZEMPIC, 0.25 OR 0.5 MG/DOSE,) 2 MG/1.5ML SOPN Inject 0.25 mg every week by subcutaneous route for 28 days.   No facility-administered medications prior to visit.   No Known  Allergies  Immunization History  Administered Date(s) Administered   Influenza, Seasonal, Injecte, Preservative Fre 08/19/2016   Influenza-Unspecified 07/25/2015, 08/19/2016, 08/11/2017, 09/13/2018, 09/07/2019, 08/22/2020   PFIZER(Purple Top)SARS-COV-2 Vaccination 12/15/2019, 01/12/2020   Td 11/23/2009    Health Maintenance  Topic Date Due   Hepatitis C Screening  Never done   TETANUS/TDAP  11/24/2019   COVID-19 Vaccine (3 - Pfizer series) 03/08/2020   INFLUENZA VACCINE  06/23/2022   PAP SMEAR-Modifier  10/29/2024   HIV Screening  Completed   HPV VACCINES  Aged Out    Patient Care Team: Wilson Singer, MD (Inactive) as PCP - General (Internal Medicine)  Review of Systems     Objective    BP 115/83 (BP Location: Left Arm, Patient Position: Sitting, Cuff Size: Normal)   Pulse 83   Temp 98.9 F (37.2 C) (Oral)   Resp 16   Ht 5\' 5"  (1.651 m)   Wt 197 lb 11.2 oz (89.7 kg)   SpO2 98%   BMI 32.90 kg/m    Physical Exam Vitals and nursing note reviewed.  Constitutional:      General: She is not in acute distress.    Appearance: Normal appearance. She is obese. She is not ill-appearing, toxic-appearing or diaphoretic.  HENT:     Head: Normocephalic and atraumatic.  Neck:     Thyroid: No thyroid mass, thyromegaly or thyroid tenderness.  Cardiovascular:     Rate and Rhythm: Normal rate and regular rhythm.     Pulses: Normal pulses.     Heart sounds: Normal heart sounds. No murmur heard.    No friction rub. No gallop.  Pulmonary:     Effort: Pulmonary effort is normal. No respiratory distress.     Breath sounds: Normal breath sounds. No stridor. No wheezing, rhonchi or rales.  Chest:     Chest wall: No tenderness.  Musculoskeletal:        General: No swelling, tenderness, deformity or signs of injury. Normal range of motion.     Cervical back: Normal range of motion and neck supple. No rigidity or tenderness.     Right lower leg: No edema.     Left lower leg: No  edema.  Lymphadenopathy:     Cervical: No cervical adenopathy.     Right cervical: No superficial, deep or posterior cervical adenopathy.    Left cervical: No superficial, deep or posterior cervical adenopathy.  Skin:    General: Skin is warm and dry.     Capillary Refill: Capillary refill takes less than 2 seconds.     Coloration: Skin is not jaundiced or pale.     Findings: No bruising,  erythema, lesion or rash.  Neurological:     General: No focal deficit present.     Mental Status: She is alert and oriented to person, place, and time. Mental status is at baseline.     Cranial Nerves: No cranial nerve deficit.     Sensory: No sensory deficit.     Motor: No weakness.     Coordination: Coordination normal.  Psychiatric:        Mood and Affect: Mood normal.        Behavior: Behavior normal.        Thought Content: Thought content normal.        Judgment: Judgment normal.     Depression Screen    05/15/2022    9:52 AM 05/20/2021    8:15 AM 03/02/2016    3:44 PM  PHQ 2/9 Scores  PHQ - 2 Score 0 0 0  PHQ- 9 Score 0    Exception Documentation   Other- indicate reason in comment box   No results found for any visits on 05/15/22.  Assessment & Plan      Problem List Items Addressed This Visit       Endocrine   Acquired hypothyroidism - Primary    Chronic, stable Hx of goiter, multi-nodular With previous bx Was originally on levothyroxine and transitioned to Armour thyroid 90 mg  Denies complication outside of chronic weight struggles Body mass index is 32.9 kg/m.       Relevant Orders   TSH+T4F+T3Free   Comprehensive metabolic panel     Other   Elevated LDL cholesterol level    Chronic, previously mild elevation Repeat NFLP I recommend diet low in saturated fat and regular exercise - 30 min at least 5 times per week Risk of heart attack/stroke remains low <1% in 10 years The 10-year ASCVD risk score (Arnett DK, et al., 2019) is: 0.3%   Values used to calculate  the score:     Age: 61 years     Sex: Female     Is Non-Hispanic African American: No     Diabetic: No     Tobacco smoker: No     Systolic Blood Pressure: 115 mmHg     Is BP treated: No     HDL Cholesterol: 70 mg/dL     Total Cholesterol: 199 mg/dL       Relevant Orders   Lipid panel   Encounter for hepatitis C screening test for low risk patient    Low risk screen Treatable, and curable. If left untreated Hep C can lead to cirrhosis and liver failure. Encourage routine testing; recommend repeat testing if risk factors change.       Relevant Orders   Hepatitis C Antibody   Obesity (BMI 30.0-34.9)    Chronic, stable Noted 10-20# weight fluctuation in last year Previously was on Ozempic for insulin resistance finding at weight loss center in [place]  Encouraged by friend who has tried phentermine with recent weight loss; patient denies anxiety, elevated HR and/on insomnia PDMP reviewed Will recommend 3 months of use with f/u at that time Can transition back to [facility] given previous success with ozempic following supply issues resolution later this fall Noted 8-10k steps/day      Relevant Orders   Comprehensive metabolic panel   CBC with Differential/Platelet   Lipid panel   Hemoglobin A1c   Other Visit Diagnoses     Encounter for screening for HIV       Relevant Orders  HIV antibody (with reflex)       Return in about 3 months (around 08/15/2022) for chonic disease management.     Leilani Merl, FNP, have reviewed all documentation for this visit. The documentation on 05/15/22 for the exam, diagnosis, procedures, and orders are all accurate and complete.    Jacky Kindle, FNP  [facility] [phone] (phone) [phone] (fax)  [facility]

## 2022-05-15 ENCOUNTER — Ambulatory Visit (INDEPENDENT_AMBULATORY_CARE_PROVIDER_SITE_OTHER): Payer: No Typology Code available for payment source | Admitting: Family Medicine

## 2022-05-15 ENCOUNTER — Encounter: Payer: Self-pay | Admitting: Family Medicine

## 2022-05-15 ENCOUNTER — Other Ambulatory Visit: Payer: Self-pay

## 2022-05-15 VITALS — BP 115/83 | HR 83 | Temp 98.9°F | Resp 16 | Ht 65.0 in | Wt 197.7 lb

## 2022-05-15 DIAGNOSIS — E7800 Pure hypercholesterolemia, unspecified: Secondary | ICD-10-CM | POA: Diagnosis not present

## 2022-05-15 DIAGNOSIS — Z1159 Encounter for screening for other viral diseases: Secondary | ICD-10-CM | POA: Insufficient documentation

## 2022-05-15 DIAGNOSIS — E039 Hypothyroidism, unspecified: Secondary | ICD-10-CM | POA: Diagnosis not present

## 2022-05-15 DIAGNOSIS — E669 Obesity, unspecified: Secondary | ICD-10-CM | POA: Insufficient documentation

## 2022-05-15 DIAGNOSIS — Z114 Encounter for screening for human immunodeficiency virus [HIV]: Secondary | ICD-10-CM

## 2022-05-15 MED ORDER — PHENTERMINE HCL 37.5 MG PO CAPS
37.5000 mg | ORAL_CAPSULE | ORAL | 0 refills | Status: DC
  Filled 2022-05-15: qty 90, 90d supply, fill #0

## 2022-05-15 NOTE — Assessment & Plan Note (Signed)
Chronic, previously mild elevation Repeat NFLP I recommend diet low in saturated fat and regular exercise - 30 min at least 5 times per week Risk of heart attack/stroke remains low <1% in 10 years The 10-year ASCVD risk score (Arnett DK, et al., 2019) is: 0.3%   Values used to calculate the score:     Age: 41 years     Sex: Female     Is Non-Hispanic African American: No     Diabetic: No     Tobacco smoker: No     Systolic Blood Pressure: 115 mmHg     Is BP treated: No     HDL Cholesterol: 70 mg/dL     Total Cholesterol: 199 mg/dL

## 2022-05-16 LAB — TSH+T4F+T3FREE
Free T4: 1.04 ng/dL (ref 0.82–1.77)
T3, Free: 5.2 pg/mL — ABNORMAL HIGH (ref 2.0–4.4)
TSH: 4.76 u[IU]/mL — ABNORMAL HIGH (ref 0.450–4.500)

## 2022-05-16 LAB — HIV ANTIBODY (ROUTINE TESTING W REFLEX): HIV Screen 4th Generation wRfx: NONREACTIVE

## 2022-05-16 LAB — COMPREHENSIVE METABOLIC PANEL
ALT: 16 IU/L (ref 0–32)
AST: 13 IU/L (ref 0–40)
Albumin/Globulin Ratio: 2.1 (ref 1.2–2.2)
Albumin: 4.8 g/dL (ref 3.8–4.8)
Alkaline Phosphatase: 52 IU/L (ref 44–121)
BUN/Creatinine Ratio: 12 (ref 9–23)
BUN: 12 mg/dL (ref 6–24)
Bilirubin Total: 0.3 mg/dL (ref 0.0–1.2)
CO2: 23 mmol/L (ref 20–29)
Calcium: 9.6 mg/dL (ref 8.7–10.2)
Chloride: 102 mmol/L (ref 96–106)
Creatinine, Ser: 0.98 mg/dL (ref 0.57–1.00)
Globulin, Total: 2.3 g/dL (ref 1.5–4.5)
Glucose: 90 mg/dL (ref 70–99)
Potassium: 4.8 mmol/L (ref 3.5–5.2)
Sodium: 139 mmol/L (ref 134–144)
Total Protein: 7.1 g/dL (ref 6.0–8.5)
eGFR: 74 mL/min/{1.73_m2} (ref >59)

## 2022-05-16 LAB — LIPID PANEL
Chol/HDL Ratio: 3.3 ratio (ref 0.0–4.4)
Cholesterol, Total: 200 mg/dL — ABNORMAL HIGH (ref 100–199)
HDL: 60 mg/dL (ref >39)
LDL Chol Calc (NIH): 129 mg/dL — ABNORMAL HIGH (ref 0–99)
Triglycerides: 63 mg/dL (ref 0–149)
VLDL Cholesterol Cal: 11 mg/dL (ref 5–40)

## 2022-05-16 LAB — CBC WITH DIFFERENTIAL/PLATELET
Basophils Absolute: 0 10*3/uL (ref 0.0–0.2)
Basos: 1 %
EOS (ABSOLUTE): 0.1 10*3/uL (ref 0.0–0.4)
Eos: 2 %
Hematocrit: 42.7 % (ref 34.0–46.6)
Hemoglobin: 14.3 g/dL (ref 11.1–15.9)
Immature Grans (Abs): 0 10*3/uL (ref 0.0–0.1)
Immature Granulocytes: 0 %
Lymphocytes Absolute: 1.2 10*3/uL (ref 0.7–3.1)
Lymphs: 24 %
MCH: 29.4 pg (ref 26.6–33.0)
MCHC: 33.5 g/dL (ref 31.5–35.7)
MCV: 88 fL (ref 79–97)
Monocytes Absolute: 0.5 10*3/uL (ref 0.1–0.9)
Monocytes: 11 %
Neutrophils Absolute: 3 10*3/uL (ref 1.4–7.0)
Neutrophils: 62 %
Platelets: 244 10*3/uL (ref 150–450)
RBC: 4.87 x10E6/uL (ref 3.77–5.28)
RDW: 12.7 % (ref 11.7–15.4)
WBC: 4.9 10*3/uL (ref 3.4–10.8)

## 2022-05-16 LAB — HEMOGLOBIN A1C
Est. average glucose Bld gHb Est-mCnc: 111 mg/dL
Hgb A1c MFr Bld: 5.5 % (ref 4.8–5.6)

## 2022-05-16 LAB — HEPATITIS C ANTIBODY: Hep C Virus Ab: NONREACTIVE

## 2022-05-18 ENCOUNTER — Encounter: Payer: Self-pay | Admitting: Family Medicine

## 2022-05-18 ENCOUNTER — Other Ambulatory Visit: Payer: Self-pay | Admitting: Family Medicine

## 2022-05-18 DIAGNOSIS — B079 Viral wart, unspecified: Secondary | ICD-10-CM

## 2022-05-19 ENCOUNTER — Other Ambulatory Visit (HOSPITAL_COMMUNITY): Payer: Self-pay

## 2022-05-19 MED ORDER — LEVOTHYROXINE SODIUM 175 MCG PO TABS
175.0000 ug | ORAL_TABLET | Freq: Every day | ORAL | 0 refills | Status: DC
  Filled 2022-05-19: qty 90, 90d supply, fill #0

## 2022-05-21 ENCOUNTER — Other Ambulatory Visit (HOSPITAL_COMMUNITY): Payer: Self-pay

## 2022-06-03 IMAGING — US US PELVIS COMPLETE WITH TRANSVAGINAL
1 series · 13 of 25 positions shown · non-contrast
Comparison: None

CLINICAL DATA: LEFT side PELV with pain for 1 week, has IUD, LEFT
lower quadrant pain for 1 year



[Series 1: gyn us · 13 of 127 slices shown]
[im 1/127]
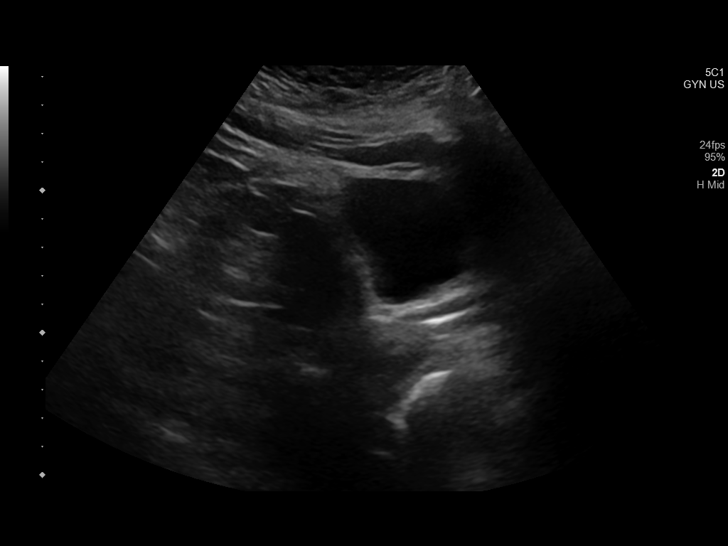
[im 11/127]
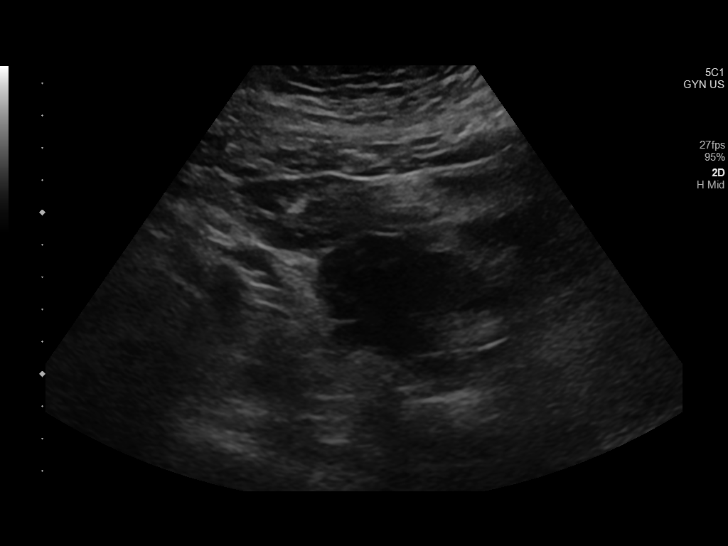
[im 22/127]
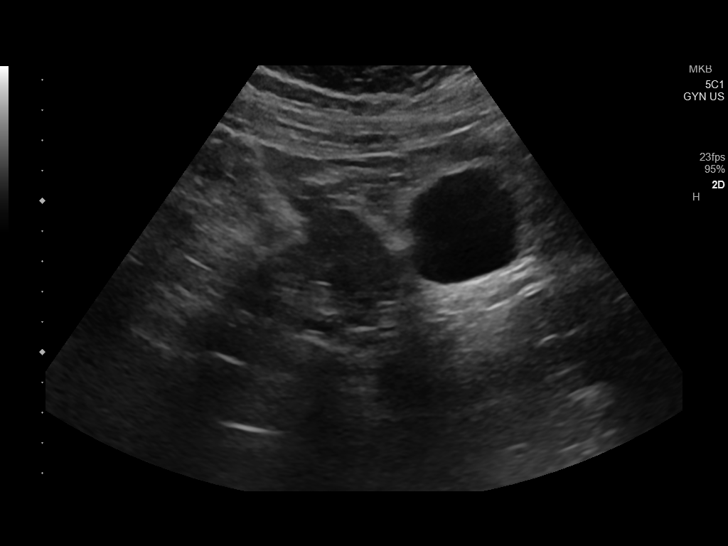
[im 32/127]
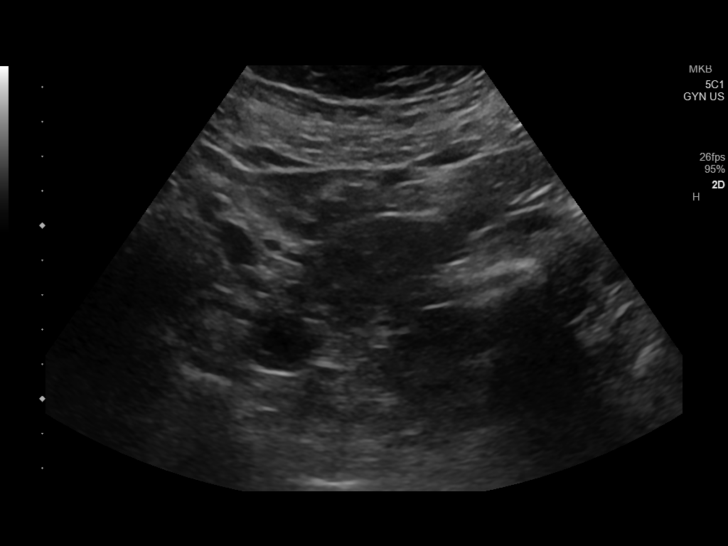
[im 43/127]
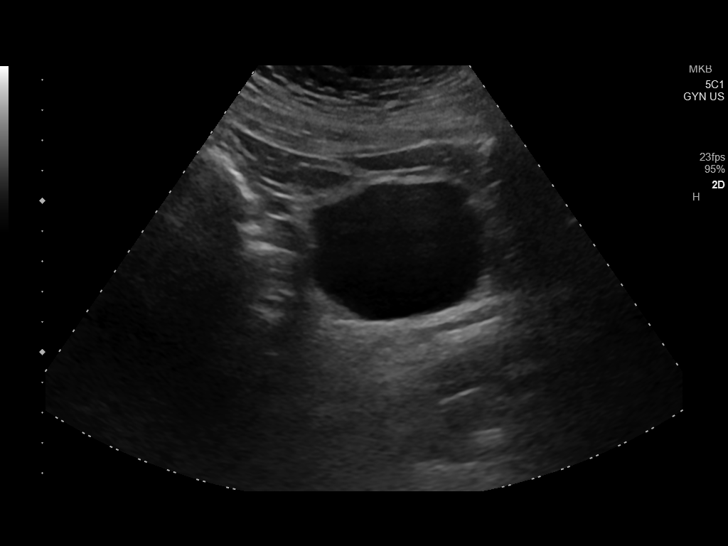
[im 53/127]
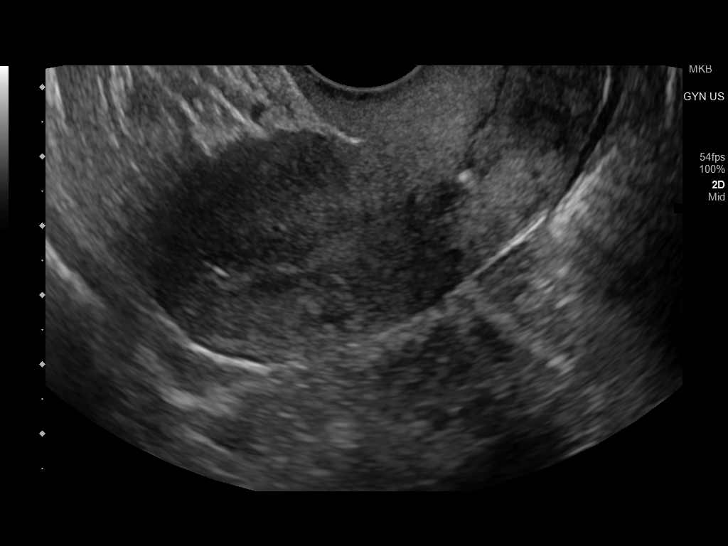
[im 64/127]
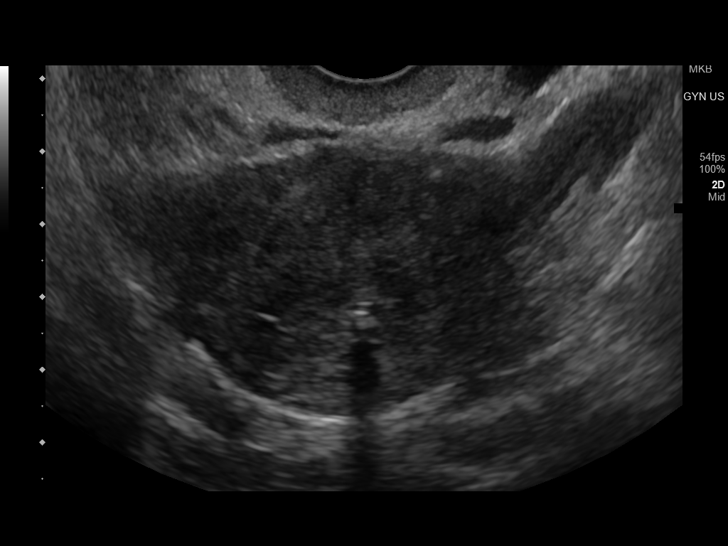
[im 74/127]
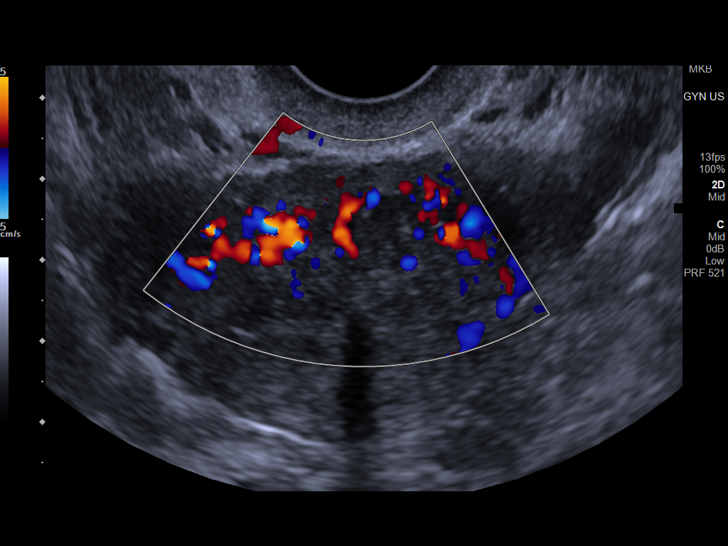
[im 85/127]
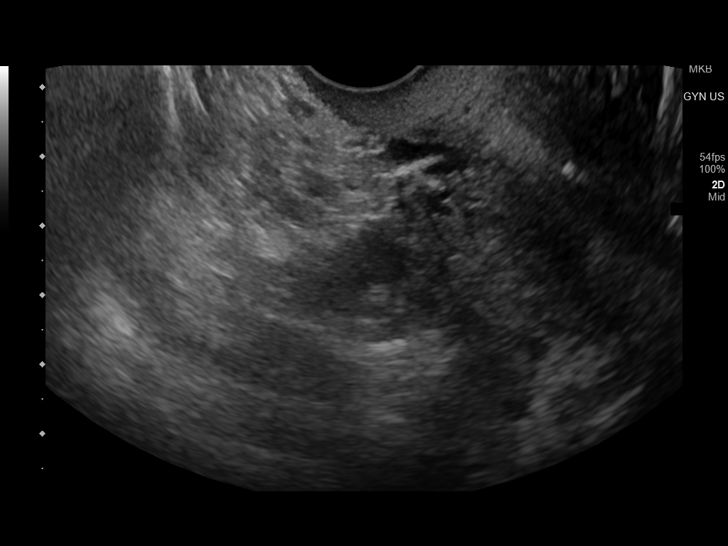
[im 95/127]
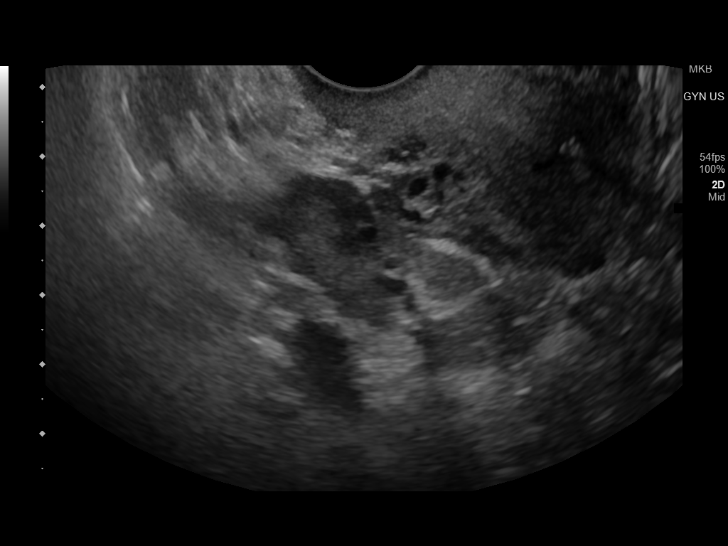
[im 106/127]
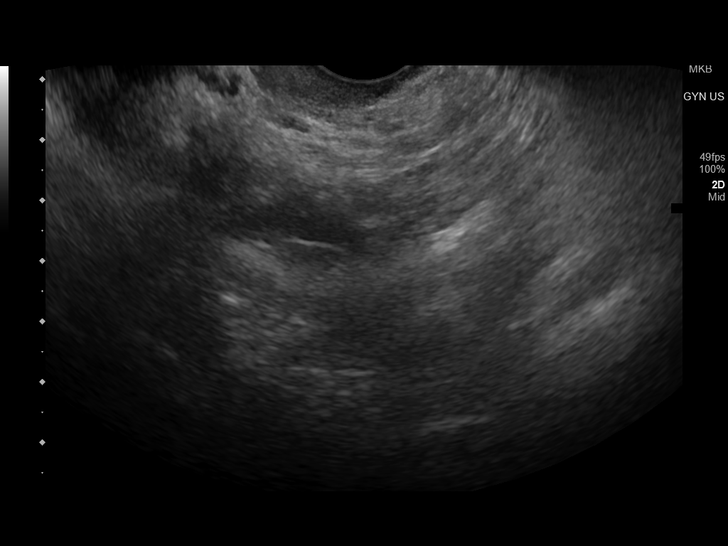
[im 116/127]
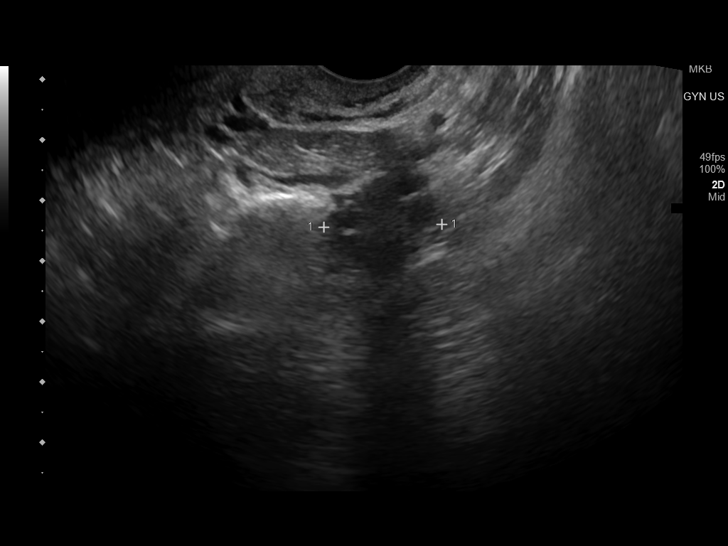
[im 127/127]
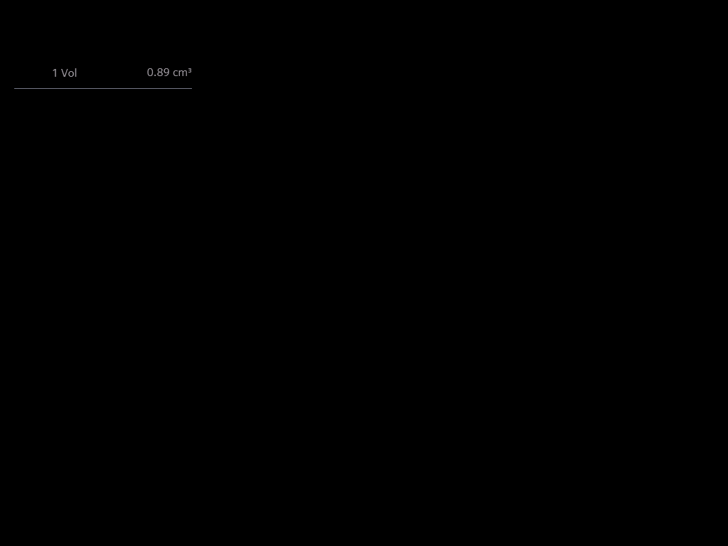

[13 of 25 positions shown; findings below may reference images not displayed]

FINDINGS: Uterus

Measurements: 6.5 x 3.5 x 4.9 cm = volume: 57 mL. Anteverted. Normal
morphology. Small intramural leiomyoma anterior mid uterus 13 mm
greatest diameter. No additional masses.

Endometrium

Thickness: 5 mm. IUD in expected position at upper uterine segment
endometrial canal. No endometrial fluid or mass

Right ovary

Measurements: 3.9 x 2.0 x 2.3 cm = volume: 9.2 mL. Normal morphology
without mass

Left ovary

Measurements: 3.4 x 2.1 x 2.0 cm = volume: 7.3 mL. Normal morphology
without mass

Other findings

No free pelvic fluid.  No adnexal masses.
IMPRESSION: IUD in expected position at upper uterine segment endometrial canal.

13 mm intramural leiomyoma anterior mid uterus.

Remainder of exam unremarkable.

## 2022-07-02 NOTE — Progress Notes (Unsigned)
      Established patient visit   Patient: Patricia Mendez   DOB: 04/19/1981   41 y.o. Female  MRN: [id number] Visit Date: 07/03/2022  Today's healthcare provider: Jacky Kindle, FNP   I,Garl Speigner J Adriene Knipfer,acting as a scribe for Jacky Kindle, FNP.,have documented all relevant documentation on the behalf of Jacky Kindle, FNP,as directed by  Jacky Kindle, FNP while in the presence of Jacky Kindle, FNP.   Chief Complaint  Patient presents with   Hypothyroidism    Patient is here to have thyroid labs rechecked.    Subjective    HPI HPI     Hypothyroidism    Additional comments: Patient is here to have thyroid labs rechecked.       Last edited by Marlana Salvage, CMA on 07/03/2022  9:04 AM.      Hypothyroid, follow-up  Lab Results  Component Value Date   TSH 4.760 (H) 05/15/2022   TSH 3.41 04/03/2021   TSH 1.27 12/16/2012   FREET4 1.04 05/15/2022    Wt Readings from Last 3 Encounters:  07/03/22 189 lb (85.7 kg)  05/15/22 197 lb 11.2 oz (89.7 kg)  10/29/21 190 lb (86.2 kg)    She was last seen for hypothyroid 6 months ago.  Management since that visit includes Armour dose was increased. She reports excellent compliance with treatment. She is not having side effects.   Symptoms: Yes change in energy level No constipation  No diarrhea No heat / cold intolerance  No nervousness No palpitations  No weight changes    -----------------------------------------------------------------------------------------   Medications: Outpatient Medications Prior to Visit  Medication Sig   levothyroxine (SYNTHROID) 175 MCG tablet Take 1 tablet (175 mcg total) by mouth daily.   phentermine 37.5 MG capsule Take 1 capsule (37.5 mg total) by mouth every morning.   levonorgestrel (MIRENA) 20 MCG/24HR IUD 1 Intra Uterine Device (1 each total) by Intrauterine route once.   No facility-administered medications prior to visit.    Review of Systems  {Labs  Heme  Chem  Endocrine   Serology  Results Review (optional):23779}   Objective    BP 110/78 (BP Location: Left Arm, Patient Position: Sitting, Cuff Size: Normal)   Pulse 83   Temp 98.7 F (37.1 C) (Oral)   Resp 16   Wt 189 lb (85.7 kg)   SpO2 98%   BMI 31.45 kg/m  {Show previous vital signs (optional):23777}  Physical Exam  ***  No results found for any visits on 07/03/22.  Assessment & Plan     ***  No follow-ups on file.      {provider attestation***:1}   Jacky Kindle, FNP  [facility] [phone] (phone) [phone] (fax)  [facility]

## 2022-07-03 ENCOUNTER — Ambulatory Visit (INDEPENDENT_AMBULATORY_CARE_PROVIDER_SITE_OTHER): Payer: No Typology Code available for payment source | Admitting: Family Medicine

## 2022-07-03 ENCOUNTER — Encounter: Payer: Self-pay | Admitting: Family Medicine

## 2022-07-03 VITALS — BP 110/78 | HR 83 | Temp 98.7°F | Resp 16 | Wt 189.0 lb

## 2022-07-03 DIAGNOSIS — E039 Hypothyroidism, unspecified: Secondary | ICD-10-CM

## 2022-07-04 LAB — TSH+T4F+T3FREE
Free T4: 2.32 ng/dL — ABNORMAL HIGH (ref 0.82–1.77)
T3, Free: 3.6 pg/mL (ref 2.0–4.4)
TSH: 0.182 u[IU]/mL — ABNORMAL LOW (ref 0.450–4.500)

## 2022-07-06 ENCOUNTER — Encounter: Payer: Self-pay | Admitting: Family Medicine

## 2022-07-06 ENCOUNTER — Other Ambulatory Visit: Payer: Self-pay | Admitting: Family Medicine

## 2022-07-06 ENCOUNTER — Other Ambulatory Visit: Payer: Self-pay

## 2022-07-06 ENCOUNTER — Telehealth: Payer: Self-pay | Admitting: Family Medicine

## 2022-07-06 ENCOUNTER — Other Ambulatory Visit (HOSPITAL_COMMUNITY): Payer: Self-pay

## 2022-07-06 DIAGNOSIS — E039 Hypothyroidism, unspecified: Secondary | ICD-10-CM

## 2022-07-06 DIAGNOSIS — R946 Abnormal results of thyroid function studies: Secondary | ICD-10-CM

## 2022-07-06 MED ORDER — LEVOTHYROXINE SODIUM 150 MCG PO TABS
150.0000 ug | ORAL_TABLET | Freq: Every day | ORAL | 0 refills | Status: DC
  Filled 2022-07-06 – 2022-08-06 (×3): qty 90, 90d supply, fill #0

## 2022-07-06 MED ORDER — LEVOTHYROXINE SODIUM 150 MCG PO TABS: 150.0000 ug | ORAL_TABLET | Freq: Every day | ORAL | 0 refills | Status: DC

## 2022-07-06 NOTE — Telephone Encounter (Signed)
Already sent to [facility] in a separate encounter.

## 2022-07-06 NOTE — Telephone Encounter (Signed)
Medication Refill - Medication: levothyroxine (SYNTHROID) 150 MCG tablet  Pt stated this was just sent by nurse and it went to wrong pharmacy . Please send to pharmacy below.   Has the patient contacted their pharmacy? No. (Agent: If no, request that the patient contact the pharmacy for the refill. If patient does not wish to contact the pharmacy document the reason why and proceed with request.)   Preferred Pharmacy (with phone number or street name):  [facility]  [address] [place] [address]  Phone: [phone] Fax: [phone]  Hours: Mon-Fri 7:30am-6pm; Sat 8:00am-4:30pm   Has the patient been seen for an appointment in the last year OR does the patient have an upcoming appointment? Yes.    Agent: Please be advised that RX refills may take up to 3 business days. We ask that you follow-up with your pharmacy.

## 2022-07-06 NOTE — Progress Notes (Signed)
Thyroid is over stimulated; recommend change to lower dose of levothyroxine to assist with repeat labs in 45-60 days.  Jacky Kindle, FNP  [facility] [address] [place], [place] [address] [phone] (phone) [phone] (fax) [facility]

## 2022-07-08 ENCOUNTER — Other Ambulatory Visit (HOSPITAL_COMMUNITY): Payer: Self-pay

## 2022-08-06 ENCOUNTER — Other Ambulatory Visit (HOSPITAL_COMMUNITY): Payer: Self-pay

## 2022-08-10 ENCOUNTER — Other Ambulatory Visit (HOSPITAL_COMMUNITY): Payer: Self-pay

## 2022-08-12 NOTE — Progress Notes (Signed)
Established patient visit   Patient: Patricia Mendez   DOB: October 17, 1981   41 y.o. Female  MRN: [id number] Visit Date: 08/14/2022  Today's healthcare provider: Gwyneth Sprout, FNP  Re Introduced to nurse practitioner role and practice setting.  All questions answered.  Discussed provider/patient relationship and expectations.  I,Tiffany J Bragg,acting as a scribe for Gwyneth Sprout, FNP.,have documented all relevant documentation on the behalf of Gwyneth Sprout, FNP,as directed by  Gwyneth Sprout, FNP while in the presence of Gwyneth Sprout, FNP.   Chief Complaint  Patient presents with   Weight Loss   Subjective    HPI  Follow up for weight loss  The patient was last seen for this 6 weeks ago. Changes made at last visit include started phentermine.  She reports excellent compliance with treatment. She feels that condition is Unchanged. She is not having side effects.   -----------------------------------------------------------------------------------------   Medications: Outpatient Medications Prior to Visit  Medication Sig   levothyroxine (SYNTHROID) 150 MCG tablet Take 1 tablet by mouth daily.   [DISCONTINUED] phentermine 37.5 MG capsule Take 1 capsule (37.5 mg total) by mouth every morning.   levonorgestrel (MIRENA) 20 MCG/24HR IUD 1 Intra Uterine Device (1 each total) by Intrauterine route once.   No facility-administered medications prior to visit.    Review of Systems  Last CBC Lab Results  Component Value Date   WBC 4.9 05/15/2022   HGB 14.3 05/15/2022   HCT 42.7 05/15/2022   MCV 88 05/15/2022   MCH 29.4 05/15/2022   RDW 12.7 05/15/2022   PLT 244 87/56/4332   Last metabolic panel Lab Results  Component Value Date   GLUCOSE 90 05/15/2022   NA 139 05/15/2022   K 4.8 05/15/2022   CL 102 05/15/2022   CO2 23 05/15/2022   BUN 12 05/15/2022   CREATININE 0.98 05/15/2022   EGFR 74 05/15/2022   CALCIUM 9.6 05/15/2022   PROT 7.1 05/15/2022   ALBUMIN 4.8  05/15/2022   LABGLOB 2.3 05/15/2022   AGRATIO 2.1 05/15/2022   BILITOT 0.3 05/15/2022   ALKPHOS 52 05/15/2022   AST 13 05/15/2022   ALT 16 05/15/2022   Last lipids Lab Results  Component Value Date   CHOL 200 (H) 05/15/2022   HDL 60 05/15/2022   LDLCALC 129 (H) 05/15/2022   TRIG 63 05/15/2022   CHOLHDL 3.3 05/15/2022   Last hemoglobin A1c Lab Results  Component Value Date   HGBA1C 5.5 05/15/2022   Last thyroid functions Lab Results  Component Value Date   TSH 0.182 (L) 07/03/2022     Objective    BP 120/85 (BP Location: Right Arm, Patient Position: Sitting, Cuff Size: Normal)   Pulse 80   Resp 16   Ht _0  (1.651 m)   Wt 184 lb (83.5 kg)   SpO2 98%   BMI 30.62 kg/m   BP Readings from Last 3 Encounters:  08/14/22 120/85  07/03/22 110/78  05/15/22 115/83   Wt Readings from Last 3 Encounters:  08/14/22 184 lb (83.5 kg)  07/03/22 189 lb (85.7 kg)  05/15/22 197 lb 11.2 oz (89.7 kg)   SpO2 Readings from Last 3 Encounters:  08/14/22 98%  07/03/22 98%  05/15/22 98%   Physical Exam Vitals and nursing note reviewed.  Constitutional:      General: She is not in acute distress.    Appearance: Normal appearance. She is obese. She is not ill-appearing, toxic-appearing or diaphoretic.  HENT:  Head: Normocephalic and atraumatic.  Cardiovascular:     Rate and Rhythm: Normal rate and regular rhythm.     Pulses: Normal pulses.     Heart sounds: Normal heart sounds. No murmur heard.    No friction rub. No gallop.  Pulmonary:     Effort: Pulmonary effort is normal. No respiratory distress.     Breath sounds: Normal breath sounds. No stridor. No wheezing, rhonchi or rales.  Chest:     Chest wall: No tenderness.  Musculoskeletal:        General: No swelling, tenderness, deformity or signs of injury. Normal range of motion.     Right lower leg: No edema.     Left lower leg: No edema.  Skin:    General: Skin is warm and dry.     Capillary Refill: Capillary  refill takes less than 2 seconds.     Coloration: Skin is not jaundiced or pale.     Findings: No bruising, erythema, lesion or rash.  Neurological:     General: No focal deficit present.     Mental Status: She is alert and oriented to person, place, and time. Mental status is at baseline.     Cranial Nerves: No cranial nerve deficit.     Sensory: No sensory deficit.     Motor: No weakness.     Coordination: Coordination normal.  Psychiatric:        Mood and Affect: Mood normal.        Behavior: Behavior normal.        Thought Content: Thought content normal.        Judgment: Judgment normal.      No results found for any visits on 08/14/22.  Assessment & Plan     Problem List Items Addressed This Visit       Endocrine   Acquired hypothyroidism    Chronic, previously overcorrected from 175-150 mcg synthroid changed Recommend repeat TSH labs       Relevant Medications   Semaglutide-Weight Management 0.25 MG/0.5ML SOAJ   Other Relevant Orders   TSH + free T4     Other   Elevated LDL cholesterol level   Relevant Medications   Semaglutide-Weight Management 0.25 MG/0.5ML SOAJ   Obesity (BMI 30.0-34.9) - Primary    Chronic, improved Wishes to continue to use phentermine to assist Denies any negative side effects Average of 1 lb/week with diet; unable to exercise d/t bladder leakage      Relevant Medications   Semaglutide-Weight Management 0.25 MG/0.5ML SOAJ   Stress incontinence of urine    Chronic, untreated Interested in bladder tack procedure as she cannot exercise or sneeze without urinating on herself  Referral to [place]       Relevant Orders   Ambulatory referral to Urology     Return in about 3 months (around 11/13/2022) for chonic disease management.      Vonna Kotyk, FNP, have reviewed all documentation for this visit. The documentation on 08/14/22 for the exam, diagnosis, procedures, and orders are all accurate and complete.    Gwyneth Sprout,  [place] [phone] (phone) [phone] (fax)  [place]

## 2022-08-14 ENCOUNTER — Encounter: Payer: Self-pay | Admitting: Family Medicine

## 2022-08-14 ENCOUNTER — Ambulatory Visit (INDEPENDENT_AMBULATORY_CARE_PROVIDER_SITE_OTHER): Payer: No Typology Code available for payment source | Admitting: Family Medicine

## 2022-08-14 ENCOUNTER — Other Ambulatory Visit (HOSPITAL_COMMUNITY): Payer: Self-pay

## 2022-08-14 VITALS — BP 120/85 | HR 80 | Resp 16 | Ht 65.0 in | Wt 184.0 lb

## 2022-08-14 DIAGNOSIS — E7800 Pure hypercholesterolemia, unspecified: Secondary | ICD-10-CM | POA: Diagnosis not present

## 2022-08-14 DIAGNOSIS — E039 Hypothyroidism, unspecified: Secondary | ICD-10-CM

## 2022-08-14 DIAGNOSIS — E66811 Obesity, class 1: Secondary | ICD-10-CM

## 2022-08-14 DIAGNOSIS — E669 Obesity, unspecified: Secondary | ICD-10-CM | POA: Diagnosis not present

## 2022-08-14 DIAGNOSIS — N393 Stress incontinence (female) (male): Secondary | ICD-10-CM | POA: Diagnosis not present

## 2022-08-14 MED ORDER — SEMAGLUTIDE-WEIGHT MANAGEMENT 0.25 MG/0.5ML ~~LOC~~ SOAJ
0.2500 mg | SUBCUTANEOUS | 0 refills | Status: DC
  Filled 2022-08-14: qty 2, 28d supply, fill #0

## 2022-08-14 MED ORDER — PHENTERMINE HCL 37.5 MG PO CAPS
37.5000 mg | ORAL_CAPSULE | ORAL | 0 refills | Status: DC
  Filled 2022-08-14: qty 90, 90d supply, fill #0

## 2022-08-14 NOTE — Assessment & Plan Note (Signed)
Chronic, untreated Interested in bladder tack procedure as she cannot exercise or sneeze without urinating on herself  Referral to URO

## 2022-08-14 NOTE — Assessment & Plan Note (Signed)
Chronic, improved Wishes to continue to use phentermine to assist Denies any negative side effects Average of 1 lb/week with diet; unable to exercise d/t bladder leakage

## 2022-08-14 NOTE — Assessment & Plan Note (Signed)
Chronic, previously overcorrected from 175-150 mcg synthroid changed Recommend repeat TSH labs

## 2022-08-15 LAB — TSH+FREE T4
Free T4: 2.04 ng/dL — ABNORMAL HIGH (ref 0.82–1.77)
TSH: 1.72 u[IU]/mL (ref 0.450–4.500)

## 2022-08-16 NOTE — Progress Notes (Signed)
Stable TSH; free T4 is slightly elevated however, improved from 1 month ago. Continue current dose.  Patricia Mendez, [address] [place], [place] [address] [phone] (phone) [phone] (fax) [place]

## 2022-08-17 ENCOUNTER — Other Ambulatory Visit (HOSPITAL_COMMUNITY): Payer: Self-pay

## 2022-08-18 ENCOUNTER — Other Ambulatory Visit (HOSPITAL_COMMUNITY): Payer: Self-pay

## 2022-08-18 ENCOUNTER — Encounter: Payer: Self-pay | Admitting: Family Medicine

## 2022-08-20 ENCOUNTER — Other Ambulatory Visit: Payer: Self-pay | Admitting: Family Medicine

## 2022-08-20 ENCOUNTER — Other Ambulatory Visit (HOSPITAL_COMMUNITY): Payer: Self-pay

## 2022-08-20 MED ORDER — BUPROPION HCL ER (XL) 300 MG PO TB24
300.0000 mg | ORAL_TABLET | Freq: Every day | ORAL | 3 refills | Status: DC
  Filled 2022-08-20: qty 90, 90d supply, fill #0
  Filled 2022-12-15: qty 90, 90d supply, fill #1
  Filled 2023-04-01: qty 90, 90d supply, fill #2

## 2022-08-27 ENCOUNTER — Encounter: Payer: Self-pay | Admitting: Urology

## 2022-08-27 ENCOUNTER — Ambulatory Visit (INDEPENDENT_AMBULATORY_CARE_PROVIDER_SITE_OTHER): Payer: No Typology Code available for payment source | Admitting: Urology

## 2022-08-27 VITALS — BP 126/86 | HR 82 | Ht 65.0 in | Wt 186.0 lb

## 2022-08-27 DIAGNOSIS — N393 Stress incontinence (female) (male): Secondary | ICD-10-CM | POA: Diagnosis not present

## 2022-08-27 LAB — BLADDER SCAN AMB NON-IMAGING

## 2022-08-27 NOTE — Progress Notes (Signed)
08/27/2022 9:58 AM   [name] [birth year] [id number]  Referring provider: Jacky Kindle, FNP [address] [place],  [place] [address]  Chief Complaint  Patient presents with   New Patient (Initial Visit)   Urinary Incontinence    HPI: 41 year old female who presents today for further evaluation of stress incontinence.  She reports that she is been dealing with this for the past 3 to 4 years and is increasingly bothersome to her.  She leaks when she laughs coughs sneezes bends over or stoops, etc.  She cannot engage in any moderate to rigorous exercise due to fear of leaking.  She does wear pads but limits her activity because of this.  It is embarrassing and frustrating.  She does feel that she has some urinary frequency and has a hard time holding it.  She does engage in toilet mapping.  She only gets up once at night to urinate.  She drinks a couple coffee in the morning, occasional soda but mostly water.  No issues with constipation.  She has minimal vaginal symptoms.  She does feel some mild vaginal pressure but no specific bulge.  She does had a Pap smear and no pathology was noted.  She has been working on weight loss.  No further children desired.  She does do Kegel exercises but somewhat irregularly.  She does not think these have helped much.   PMH: Past Medical History:  Diagnosis Date   ASCUS of cervix with negative high risk HPV on 10/29/2021 10/29/2021   History of abnormal cervical Pap smear    Thyroid disease    hashimoto's, goiter    Surgical History: Past Surgical History:  Procedure Laterality Date   INTRAUTERINE DEVICE (IUD) INSERTION  07/19/2012   Mirena   THYROID SURGERY  06/25/2009   bilateral FNA    Home Medications:  Allergies as of 08/27/2022   No Known Allergies      Medication List        Accurate as of August 27, 2022  9:58 AM. If you have any questions, ask your nurse or doctor.          buPROPion 300 MG 24 hr  tablet Commonly known as: Wellbutrin XL Take 1 tablet (300 mg total) by mouth daily.   levonorgestrel 20 MCG/24HR IUD Commonly known as: MIRENA 1 Intra Uterine Device (1 each total) by Intrauterine route once.   levothyroxine 150 MCG tablet Commonly known as: SYNTHROID Take 1 tablet by mouth daily.   phentermine 37.5 MG capsule Take 1 capsule (37.5 mg total) by mouth every morning.   Semaglutide-Weight Management 0.25 MG/0.5ML Soaj Inject 0.25 mg into the skin once a week.        Allergies: No Known Allergies  Family History: Family History  Problem Relation Age of Onset   Hypertension Mother    Diabetes Father    Hypertension Father    Heart attack Father    Multiple sclerosis Sister    Ovarian cancer Paternal Aunt    Lymphoma Paternal Grandmother    Lung cancer Paternal Grandfather     Social History:  reports that she has never smoked. She has never used smokeless tobacco. She reports current alcohol use of about 4.0 standard drinks of alcohol per week. She reports that she does not use drugs.   Physical Exam: BP 126/86   Pulse 82   Ht 5\' 5"  (1.651 m)   Wt 186 lb (84.4 kg)   BMI 30.95 kg/m  Constitutional:  Alert and oriented, No acute distress. HEENT: [place] AT, moist mucus membranes.  Trachea midline, no masses. Neurologic: Grossly intact, no focal deficits, moving all 4 extremities. Psychiatric: Normal mood and affect.  Laboratory Data: Lab Results  Component Value Date   WBC 4.9 05/15/2022   HGB 14.3 05/15/2022   HCT 42.7 05/15/2022   MCV 88 05/15/2022   PLT 244 05/15/2022    Lab Results  Component Value Date   CREATININE 0.98 05/15/2022    Lab Results  Component Value Date   HGBA1C 5.5 05/15/2022    Pertinent Imaging: Results for orders placed or performed in visit on 08/27/22  Bladder Scan (Post Void Residual) in office  Result Value Ref Range   Scan Result 0ML     Assessment & Plan:    1. Stress incontinence, female Primarily  bothered by urinary stress incontinence  We discussed a conservative approach including continued weight loss, work with physical therapy to strengthen pelvic floor muscles, etc.  We discussed behavioral modification as well.  Alternatively, we discussed urethral bulking agents versus mid urethr+al sling placement.  She is interested in meeting with one of my partners, Dr. Matilde Sprang to discuss this further.  She understands she will likely need urodynamics.  She does have some urge component but is not particular bothered by this.  She is not interested in pharmacotherapy for this. - Urinalysis, Complete - Bladder Scan (Post Void Residual) in office   Return for follow up with Dr Lannette Donath.  Hollice Espy, MD  [facility] [address] [place],  [address] [phone]

## 2022-08-31 ENCOUNTER — Encounter: Payer: Self-pay | Admitting: Urology

## 2022-08-31 ENCOUNTER — Ambulatory Visit (INDEPENDENT_AMBULATORY_CARE_PROVIDER_SITE_OTHER): Payer: No Typology Code available for payment source | Admitting: Urology

## 2022-08-31 VITALS — BP 123/80 | HR 84 | Wt 187.0 lb

## 2022-08-31 DIAGNOSIS — N3946 Mixed incontinence: Secondary | ICD-10-CM

## 2022-08-31 DIAGNOSIS — N393 Stress incontinence (female) (male): Secondary | ICD-10-CM | POA: Diagnosis not present

## 2022-08-31 NOTE — Progress Notes (Signed)
08/31/2022 8:44 AM   Patricia Mendez 07-26-1981 [id number]  Referring provider: Gwyneth Sprout, [place],  [place] [address]  Chief Complaint  Patient presents with   Follow-up    HPI: Patricia Mendez: SUI with urgency; option with PT discussed; no pads but limits activity  Today Was consulted to assist the patient's stress incontinence.  She leaks with coughing sneezing sometimes bending lifting.  She will leak more when she is active or jumping or with sudden movements.  She normally does not wear a pad but when she is exercising the pad is damp.  She now works in the [employer] as a Marine scientist.  No urge incontinence no bedwetting.  Voids every 1 hour cannot hold it for 2 hours.  Gets up once at night.  Flow is reasonable.  She does not feel empty.  Flow is steady.  No hysterectomy  No history of bladder surgery kidney stones or urinary tract infection.  No neurologic issues.  Bowel function normal  PMH: Past Medical History:  Diagnosis Date   ASCUS of cervix with negative high risk HPV on 10/29/2021 10/29/2021   History of abnormal cervical Pap smear    Thyroid disease    hashimoto's, goiter    Surgical History: Past Surgical History:  Procedure Laterality Date   INTRAUTERINE DEVICE (IUD) INSERTION  07/19/2012   Mirena   THYROID SURGERY  06/25/2009   bilateral FNA    Home Medications:  Allergies as of 08/31/2022   No Known Allergies      Medication List        Accurate as of August 31, 2022  8:44 AM. If you have any questions, ask your nurse or doctor.          buPROPion 300 MG 24 hr tablet Commonly known as: Wellbutrin XL Take 1 tablet (300 mg total) by mouth daily.   levonorgestrel 20 MCG/24HR IUD Commonly known as: MIRENA 1 Intra Uterine Device (1 each total) by Intrauterine route once.   levothyroxine 150 MCG tablet Commonly known as: SYNTHROID Take 1 tablet by mouth daily.   phentermine 37.5 MG capsule Take 1 capsule (37.5 mg  total) by mouth every morning.   Semaglutide-Weight Management 0.25 MG/0.5ML Soaj Inject 0.25 mg into the skin once a week.        Allergies: No Known Allergies  Family History: Family History  Problem Relation Age of Onset   Hypertension Mother    Diabetes Father    Hypertension Father    Heart attack Father    Multiple sclerosis Sister    Ovarian cancer Paternal Aunt    Lymphoma Paternal Grandmother    Lung cancer Paternal Grandfather     Social History:  reports that she has never smoked. She has never used smokeless tobacco. She reports current alcohol use of about 4.0 standard drinks of alcohol per week. She reports that she does not use drugs.  ROS:                                        Physical Exam: There were no vitals taken for this visit.  Constitutional:  Alert and oriented, No acute distress. HEENT: [place] AT, moist mucus membranes.  Trachea midline, no masses. Cardiovascular: No clubbing, cyanosis, or edema. Respiratory: Normal respiratory effort, no increased work of breathing. GI: Abdomen is soft, nontender, nondistended, no abdominal masses GU: On pelvic  examination patient had grade 2 or greater hypermobility of the bladder neck and negative cough test.  She had a lot of rotational descensus of the smaller grade 2 cystocele.  Her cervix descended from 8 or 9 cm to approximately 6 cm.  She had a distal grade 1 rectocele.  She had elasticity of tissues and her degree of anterior rotational descent was impressive.  She had 2 suburethral swellings underneath the mid urethra that were symmetric Skin: No rashes, bruises or suspicious lesions. Lymph: No cervical or inguinal adenopathy. Neurologic: Grossly intact, no focal deficits, moving all 4 extremities. Psychiatric: Normal mood and affect.  Laboratory Data: Lab Results  Component Value Date   WBC 4.9 05/15/2022   HGB 14.3 05/15/2022   HCT 42.7 05/15/2022   MCV 88 05/15/2022   PLT  244 05/15/2022    Lab Results  Component Value Date   CREATININE 0.98 05/15/2022    No results found for: "PSA"  No results found for: "TESTOSTERONE"  Lab Results  Component Value Date   HGBA1C 5.5 05/15/2022    Urinalysis No results found for: "COLORURINE", "APPEARANCEUR", "LABSPEC", "PHURINE", "GLUCOSEU", "HGBUR", "BILIRUBINUR", "KETONESUR", "PROTEINUR", "UROBILINOGEN", "NITRITE", "LEUKOCYTESUR"  Pertinent Imaging: Urine reviewed.  Urine sent for culture.  Chart reviewed.  Assessment & Plan: Patient has primarily stress incontinence affecting her quality life that is milder.  She has hourly frequency and mild nocturia.  She does not feel empty and her flow is reasonable.  She will return for urodynamics and cystoscopy and we will proceed accordingly.  She may be an excellent candidate for urethral bulking agent based upon severity of symptoms and age.  A picture was drawn.  She understands that she has a moderate amount of prolapse that is not symptomatic or is minimally symptomatic but a sling could complicate its presence.  We would look for the presence of a urethral diverticulum during urodynamics but I do not think she has 1.  If she had ever had a sling I would recommend a MRI prior.  This was discussed.  In my opinion she could have a urethral bulking agent without an MRI as long as cystoscopy is normal.  Bulking agent also be a good option with her current degree of prolapse.  Natural history of prolapse discussed in the presence of mesh may complicate that  I think physical therapy should also be strongly considered and this will be discussed next visit as well.  Theoretically this could help the natural history of her prolapse   There are no diagnoses linked to this encounter.  No follow-ups on file.  Martina Sinner, MD  [facility] [address] [place], [place] [address] [phone]

## 2022-09-01 LAB — URINALYSIS, COMPLETE
Bilirubin, UA: NEGATIVE
Glucose, UA: NEGATIVE
Ketones, UA: NEGATIVE
Nitrite, UA: NEGATIVE
Protein,UA: NEGATIVE
Specific Gravity, UA: <1.005 — ABNORMAL LOW (ref 1.005–1.030)
Urobilinogen, Ur: 0.2 mg/dL (ref 0.2–1.0)
pH, UA: 6 (ref 5.0–7.5)

## 2022-09-01 LAB — MICROSCOPIC EXAMINATION

## 2022-09-02 ENCOUNTER — Encounter: Payer: Self-pay | Admitting: Urology

## 2022-09-03 LAB — CULTURE, URINE COMPREHENSIVE

## 2022-09-28 ENCOUNTER — Encounter: Payer: Self-pay | Admitting: Urology

## 2022-10-01 ENCOUNTER — Other Ambulatory Visit (HOSPITAL_COMMUNITY): Payer: Self-pay

## 2022-10-12 ENCOUNTER — Encounter: Payer: Self-pay | Admitting: Urology

## 2022-10-12 ENCOUNTER — Ambulatory Visit (INDEPENDENT_AMBULATORY_CARE_PROVIDER_SITE_OTHER): Payer: No Typology Code available for payment source | Admitting: Urology

## 2022-10-12 VITALS — BP 117/79 | HR 80 | Ht 65.0 in | Wt 180.0 lb

## 2022-10-12 DIAGNOSIS — N393 Stress incontinence (female) (male): Secondary | ICD-10-CM

## 2022-10-12 LAB — URINALYSIS, COMPLETE
Bilirubin, UA: NEGATIVE
Glucose, UA: NEGATIVE
Leukocytes,UA: NEGATIVE
Nitrite, UA: NEGATIVE
Protein,UA: NEGATIVE
Specific Gravity, UA: 1.02 (ref 1.005–1.030)
Urobilinogen, Ur: 2 mg/dL — ABNORMAL HIGH (ref 0.2–1.0)
pH, UA: 5.5 (ref 5.0–7.5)

## 2022-10-12 LAB — MICROSCOPIC EXAMINATION: Epithelial Cells (non renal): >10 /hpf — AB (ref 0–10)

## 2022-10-12 NOTE — Progress Notes (Signed)
10/12/2022 9:22 AM   Patricia Mendez October 08, 1981 [id number]  Referring provider: Jacky Kindle, FNP [address] [place],  [place] [address]  Chief Complaint  Patient presents with   Cysto    HPI: Patricia Mendez: SUI with urgency; option with PT discussed; no pads but limits activity   Today Was consulted to assist the patient's stress incontinence.  She leaks with coughing sneezing sometimes bending lifting.  She will leak more when she is active or jumping or with sudden movements.  She normally does not wear a pad but when she is exercising the pad is damp.  She now works in the [employer] as a Engineer, civil (consulting).  No urge incontinence no bedwetting.   Voids every 1 hour cannot hold it for 2 hours.  Gets up once at night.  Flow is reasonable.  She does not feel empty.  Flow is steady.   No hysterectomy    On pelvic examination patient had grade 2 or greater hypermobility of the bladder neck and negative cough test.  She had a lot of rotational descensus of the smaller grade 2 cystocele.  Her cervix descended from 8 or 9 cm to approximately 6 cm.  She had a distal grade 1 rectocele.  She had elasticity of tissues and her degree of anterior rotational descent was impressive.  She had 2 suburethral swellings underneath the mid urethra that were symmetric   Patient has primarily stress incontinence affecting her quality life that is milder.  She has hourly frequency and mild nocturia.  She does not feel empty and her flow is reasonable.  She will return for urodynamics and cystoscopy and we will proceed accordingly.  She may be an excellent candidate for urethral bulking agent based upon severity of symptoms and age.   A picture was drawn.  She understands that she has a moderate amount of prolapse that is not symptomatic or is minimally symptomatic but a sling could complicate its presence.  We would look for the presence of a urethral diverticulum during urodynamics but I do not think she has 1.  If  she had ever had a sling I would recommend a MRI prior.  This was discussed.  In my opinion she could have a urethral bulking agent without an MRI as long as cystoscopy is normal.  Bulking agent also be a good option with her current degree of prolapse.  Natural history of prolapse discussed in the presence of mesh may complicate that   I think physical therapy should also be strongly considered and this will be discussed next visit as well.  Theoretically this could help the natural history of her prolapse  Today Frequency stable.  Urinary incontinence stable.  Last culture negative During urodynamics patient did not void and was catheterized for 100 mL.  Maximum bladder capacity was 460 mL.  Bladder was stable.  Her Valsalva leak point pressure at 300 mL was 90 cm of water with mild leakage and the prolapse reduced.  Her cough leak point pressure at 400 mL was 96 cm of water with severe leak each.  Valsalva leak point pressure at 400 mL was 76 cm of water with high-volume leakage and no reduction of prolapse.  Stress incontinence was with and without prolapse reduced.  During voiding she voided 459 mL with a maximal flow of 5 mils per second.  She had a prolonged interrupted pattern as noted.  She was doing a lot of abdominal straining.  I do not think she  was generating a strong detrusor contraction when I looked at the readings myself.  There was not good subtracting from the abdominal line.  Postvoid residual was 0 mL EMG activity was quiet during the study.  She had a moderate cystocele noted.  Ladder neck descent at 1 to 2 cm there was no evidence of urethral diverticulum.  Mild trabeculation noted.  The details of the urodynamics are signed dictated  Patient says her primary complaint is stress incontinence so she is limiting her activity.  When she is not active she stays dry but has to void frequently.  She also timed voids to try to stay drier.  In retrospect she says she might feel something in  the vagina but she really did not know the baseline in terms of prolapse symptoms  She reports her flow was poor but is usually due to frequent voiding.  When she holds it for a few hours the flow is much better.  Again on pelvic examination she has impressive rotational descent with a grade 2 cystocele anteriorly with a mild central defect and a rectocele is quite impressive in terms of its size and with although is not bulging a great deal.  She had hypermobility of the bladder neck and negative cough test after cystoscopy.  2 suburethral swellings noted nontender  Cystoscopy: Patient underwent flexible cystoscopy.  Bladder close and trigone were normal.  No evidence of urethral diverticulum.  There was some white flecks in the urine I sent the urine for culture.         PMH: Past Medical History:  Diagnosis Date   ASCUS of cervix with negative high risk HPV on 10/29/2021 10/29/2021   History of abnormal cervical Pap smear    Thyroid disease    hashimoto's, goiter    Surgical History: Past Surgical History:  Procedure Laterality Date   INTRAUTERINE DEVICE (IUD) INSERTION  07/19/2012   Mirena   THYROID SURGERY  06/25/2009   bilateral FNA    Home Medications:  Allergies as of 10/12/2022   No Known Allergies      Medication List        Accurate as of October 12, 2022  9:22 AM. If you have any questions, ask your nurse or doctor.          buPROPion 300 MG 24 hr tablet Commonly known as: Wellbutrin XL Take 1 tablet (300 mg total) by mouth daily.   levonorgestrel 20 MCG/24HR IUD Commonly known as: MIRENA 1 Intra Uterine Device (1 each total) by Intrauterine route once.   levothyroxine 150 MCG tablet Commonly known as: SYNTHROID Take 1 tablet by mouth daily.   phentermine 37.5 MG capsule Take 1 capsule (37.5 mg total) by mouth every morning.   Semaglutide-Weight Management 0.25 MG/0.5ML Soaj Inject 0.25 mg into the skin once a week.        Allergies: No  Known Allergies  Family History: Family History  Problem Relation Age of Onset   Hypertension Mother    Diabetes Father    Hypertension Father    Heart attack Father    Multiple sclerosis Sister    Ovarian cancer Paternal Aunt    Lymphoma Paternal Grandmother    Lung cancer Paternal Grandfather     Social History:  reports that she has never smoked. She has never been exposed to tobacco smoke. She has never used smokeless tobacco. She reports current alcohol use of about 4.0 standard drinks of alcohol per week. She reports that she does  not use drugs.  ROS:                                        Physical Exam: There were no vitals taken for this visit.  Constitutional:  Alert and oriented, No acute distress. HEENT: [place] AT, moist mucus membranes.  Trachea midline, no masses.   Laboratory Data: Lab Results  Component Value Date   WBC 4.9 05/15/2022   HGB 14.3 05/15/2022   HCT 42.7 05/15/2022   MCV 88 05/15/2022   PLT 244 05/15/2022    Lab Results  Component Value Date   CREATININE 0.98 05/15/2022    No results found for: "PSA"  No results found for: "TESTOSTERONE"  Lab Results  Component Value Date   HGBA1C 5.5 05/15/2022    Urinalysis    Component Value Date/Time   APPEARANCEUR Clear 08/31/2022 0850   GLUCOSEU Negative 08/31/2022 0850   BILIRUBINUR Negative 08/31/2022 0850   PROTEINUR Negative 08/31/2022 0850   NITRITE Negative 08/31/2022 0850   LEUKOCYTESUR Trace (A) 08/31/2022 0850    Pertinent Imaging:   Assessment & Plan: Patient was drawn.  Patient has a lot of relative reasons that a sling would be less ideal.  Her incontinence is mild and she is young.  She has a lot of rotational descent with cystocele that would make tensioning a bit more challenging but more still an issue as her prolapse worsens with aging if it does.  Over time she may be at increased risk of a hinge effect.  She may be at increased risk of retention  based upon her straining voiding phase.  I think a bulking agent is an excellent option for her and this was discussed in full detail with template.  If she had a sling prolapse surgery could be entertained simultaneously but again due to lack of symptoms I think this is not necessarily in her best interest.  Role of physical therapy discussed.  We also had a lengthy discussion about an anterior and posterior repair and sling.  Early on in the discussion and throughout she really would like to consider having prolapse surgery because she is a young woman and she thinks it probably will worsen over time.  She would like to have this done with a sling.  She understands why I will refer her to Dr. Florian Buff and make these arrangements.  Expectations on referral time discussed.  She is looking for the most definitive therapy and would like her prolapse addressed simultaneously.  I think she has made a very good decision for herself.  1. Stress incontinence, female  - Urinalysis, Complete   No follow-ups on file.  Martina Sinner, MD  [facility] [address] [place], [place] [address] [phone]

## 2022-10-15 LAB — CULTURE, URINE COMPREHENSIVE

## 2022-11-02 ENCOUNTER — Other Ambulatory Visit: Payer: Self-pay | Admitting: Family Medicine

## 2022-11-02 ENCOUNTER — Other Ambulatory Visit (HOSPITAL_COMMUNITY): Payer: Self-pay

## 2022-11-02 DIAGNOSIS — E039 Hypothyroidism, unspecified: Secondary | ICD-10-CM

## 2022-11-02 MED ORDER — LEVOTHYROXINE SODIUM 150 MCG PO TABS
150.0000 ug | ORAL_TABLET | Freq: Every day | ORAL | 0 refills | Status: DC
  Filled 2022-11-02: qty 90, 90d supply, fill #0

## 2022-11-12 ENCOUNTER — Other Ambulatory Visit: Payer: Self-pay

## 2022-11-12 ENCOUNTER — Other Ambulatory Visit (HOSPITAL_COMMUNITY): Payer: Self-pay

## 2022-11-12 ENCOUNTER — Ambulatory Visit (INDEPENDENT_AMBULATORY_CARE_PROVIDER_SITE_OTHER): Payer: No Typology Code available for payment source | Admitting: Family Medicine

## 2022-11-12 ENCOUNTER — Encounter: Payer: Self-pay | Admitting: Family Medicine

## 2022-11-12 VITALS — BP 116/79 | HR 88 | Temp 98.3°F | Wt 174.5 lb

## 2022-11-12 DIAGNOSIS — Z6829 Body mass index (BMI) 29.0-29.9, adult: Secondary | ICD-10-CM | POA: Diagnosis not present

## 2022-11-12 DIAGNOSIS — Z7689 Persons encountering health services in other specified circumstances: Secondary | ICD-10-CM | POA: Insufficient documentation

## 2022-11-12 DIAGNOSIS — E663 Overweight: Secondary | ICD-10-CM | POA: Diagnosis not present

## 2022-11-12 MED ORDER — PHENTERMINE HCL 37.5 MG PO CAPS
37.5000 mg | ORAL_CAPSULE | ORAL | 0 refills | Status: DC
  Filled 2022-11-12: qty 90, 90d supply, fill #0

## 2022-11-12 NOTE — Assessment & Plan Note (Signed)
Body mass index is 29.04 kg/m. Continue to recommend balanced, lower carb meals. Smaller meal size, adding snacks. Choosing water as drink of choice and increasing purposeful exercise. Discussed importance of healthy weight management Discussed diet and exercise

## 2022-11-12 NOTE — Progress Notes (Signed)
I,Connie R Striblin,acting as a Neurosurgeon for Jacky Kindle, FNP.,have documented all relevant documentation on the behalf of Jacky Kindle, FNP,as directed by  Jacky Kindle, FNP while in the presence of Jacky Kindle, FNP.   Established patient visit   Patient: Patricia Mendez   DOB: Apr 01, 1981   41 y.o. Female  MRN: [id number] Visit Date: 11/12/2022  Today's healthcare provider: Jacky Kindle, FNP  Re Introduced to nurse practitioner role and practice setting.  All questions answered.  Discussed provider/patient relationship and expectations.  Chief Complaint  Patient presents with   Obesity   Subjective    HPI  Follow up for Weight Management:  The patient was last seen for this 3 months ago. Changes made at last visit include continuing phentermine. .  She reports excellent compliance with treatment. She feels that condition is Improved. She is not having side effects.   Pt has not started wegovy   -----------------------------------------------------------------------------------------   Medications: Outpatient Medications Prior to Visit  Medication Sig   buPROPion (WELLBUTRIN XL) 300 MG 24 hr tablet Take 1 tablet (300 mg total) by mouth daily.   levonorgestrel (MIRENA) 20 MCG/24HR IUD 1 Intra Uterine Device (1 each total) by Intrauterine route once.   levothyroxine (SYNTHROID) 150 MCG tablet Take 1 tablet by mouth daily.   Semaglutide-Weight Management 0.25 MG/0.5ML SOAJ Inject 0.25 mg into the skin once a week.   [DISCONTINUED] phentermine 37.5 MG capsule Take 1 capsule (37.5 mg total) by mouth every morning.   No facility-administered medications prior to visit.   Review of Systems No concerns at this time    Objective    BP 116/79 (BP Location: Right Arm, Patient Position: Sitting, Cuff Size: Normal)   Pulse 88   Temp 98.3 F (36.8 C) (Oral)   Wt 174 lb 8 oz (79.2 kg)   SpO2 100%   BMI 29.04 kg/m   Wt Readings from Last 3 Encounters:  11/12/22 174  lb 8 oz (79.2 kg)  10/12/22 180 lb (81.6 kg)  08/31/22 187 lb (84.8 kg)   Physical Exam Vitals and nursing note reviewed.  Constitutional:      General: She is not in acute distress.    Appearance: Normal appearance. She is overweight. She is not ill-appearing, toxic-appearing or diaphoretic.  HENT:     Head: Normocephalic and atraumatic.  Cardiovascular:     Rate and Rhythm: Normal rate and regular rhythm.     Pulses: Normal pulses.     Heart sounds: Normal heart sounds. No murmur heard.    No friction rub. No gallop.  Pulmonary:     Effort: Pulmonary effort is normal. No respiratory distress.     Breath sounds: Normal breath sounds. No stridor. No wheezing, rhonchi or rales.  Chest:     Chest wall: No tenderness.  Musculoskeletal:        General: No swelling, tenderness, deformity or signs of injury. Normal range of motion.     Right lower leg: No edema.     Left lower leg: No edema.  Skin:    General: Skin is warm and dry.     Capillary Refill: Capillary refill takes less than 2 seconds.     Coloration: Skin is not jaundiced or pale.     Findings: No bruising, erythema, lesion or rash.  Neurological:     General: No focal deficit present.     Mental Status: She is alert and oriented to person, place, and  time. Mental status is at baseline.     Cranial Nerves: No cranial nerve deficit.     Sensory: No sensory deficit.     Motor: No weakness.     Coordination: Coordination normal.  Psychiatric:        Mood and Affect: Mood normal.        Behavior: Behavior normal.        Thought Content: Thought content normal.        Judgment: Judgment normal.     No results found for any visits on 11/12/22.  Assessment & Plan     Problem List Items Addressed This Visit       Other   Encounter for weight management - Primary    3 month follow up for follow up of phentermine and wellbutrin 300 xr Pt is down 25# combined; slow, steady weight loss Has not been able to start  injection medication d/t supply/demand issues Denies palpitations, anxiety, sleep disturbance, nausea with medication Noted fatigue with wellbutrin and is not taking medication qHS to assist and symptoms have improved RTC in 3 months; refills provided       Overweight with body mass index (BMI) of 29 to 29.9 in adult    Body mass index is 29.04 kg/m. Continue to recommend balanced, lower carb meals. Smaller meal size, adding snacks. Choosing water as drink of choice and increasing purposeful exercise. Discussed importance of healthy weight management Discussed diet and exercise       Return in about 3 months (around 02/11/2023) for chonic disease management.     Vonna Kotyk, FNP, have reviewed all documentation for this visit. The documentation on 11/12/22 for the exam, diagnosis, procedures, and orders are all accurate and complete.  Gwyneth Sprout, [place] [phone] (phone) [phone] (fax)  [place]

## 2022-11-12 NOTE — Assessment & Plan Note (Signed)
3 month follow up for follow up of phentermine and wellbutrin 300 xr Pt is down 25# combined; slow, steady weight loss Has not been able to start injection medication d/t supply/demand issues Denies palpitations, anxiety, sleep disturbance, nausea with medication Noted fatigue with wellbutrin and is not taking medication qHS to assist and symptoms have improved RTC in 3 months; refills provided

## 2022-11-20 ENCOUNTER — Encounter: Payer: Self-pay | Admitting: Obstetrics and Gynecology

## 2022-11-20 ENCOUNTER — Ambulatory Visit (INDEPENDENT_AMBULATORY_CARE_PROVIDER_SITE_OTHER): Payer: No Typology Code available for payment source | Admitting: Obstetrics and Gynecology

## 2022-11-20 VITALS — BP 112/79 | HR 97 | Ht 64.5 in | Wt 173.2 lb

## 2022-11-20 DIAGNOSIS — N812 Incomplete uterovaginal prolapse: Secondary | ICD-10-CM | POA: Diagnosis not present

## 2022-11-20 DIAGNOSIS — N8110 Cystocele, unspecified: Secondary | ICD-10-CM

## 2022-11-20 DIAGNOSIS — N816 Rectocele: Secondary | ICD-10-CM

## 2022-11-20 DIAGNOSIS — R350 Frequency of micturition: Secondary | ICD-10-CM

## 2022-11-20 DIAGNOSIS — R87610 Atypical squamous cells of undetermined significance on cytologic smear of cervix (ASC-US): Secondary | ICD-10-CM | POA: Diagnosis not present

## 2022-11-20 DIAGNOSIS — N393 Stress incontinence (female) (male): Secondary | ICD-10-CM | POA: Diagnosis not present

## 2022-11-20 LAB — POCT URINALYSIS DIPSTICK
Bilirubin, UA: NEGATIVE
Glucose, UA: NEGATIVE
Ketones, UA: NEGATIVE
Leukocytes, UA: NEGATIVE
Nitrite, UA: NEGATIVE
Protein, UA: NEGATIVE
Spec Grav, UA: 1.015 (ref 1.010–1.025)
Urobilinogen, UA: 0.2 E.U./dL
pH, UA: 5.5 (ref 5.0–8.0)

## 2022-11-20 NOTE — Patient Instructions (Signed)
You have a stage 2 (out of 4) prolapse.  We discussed the fact that it is not life threatening but there are several treatment options. For treatment of pelvic organ prolapse, we discussed options for management including expectant management, conservative management, and surgical management, such as Kegels, a pessary, pelvic floor physical therapy, and specific surgical procedures.    

## 2022-11-20 NOTE — Progress Notes (Signed)
[facility] New Patient Evaluation and Consultation  Referring Provider: Alfredo Martinez, MD PCP: Jacky Kindle, FNP Date of Service: 11/20/2022  SUBJECTIVE Chief Complaint: New Patient (Initial Visit)  History of Present Illness: Patricia Mendez is a 41 y.o. White or Caucasian female seen in consultation at the request of Dr. Sherron Monday for evaluation of prolapse and incontinence.    Review of records significant for: Had urodynamics at [facility]:  During urodynamics patient did not void and was catheterized for 100 mL. Maximum bladder capacity was 460 mL. Bladder was stable. Her Valsalva leak point pressure at 300 mL was 90 cm of water with mild leakage and the prolapse reduced. Her cough leak point pressure at 400 mL was 96 cm of water with severe leak each. Valsalva leak point pressure at 400 mL was 76 cm of water with high-volume leakage and no reduction of prolapse. Stress incontinence was with and without prolapse reduced. During voiding she voided 459 mL with a maximal flow of 5 mils per second. She had a prolonged interrupted pattern as noted. She was doing a lot of abdominal straining. I do not think she was generating a strong detrusor contraction when I looked at the readings myself. There was not good subtracting from the abdominal line. Postvoid residual was 0 mL EMG activity was quiet during the study. She had a moderate cystocele noted. Bladder neck descent at 1 to 2 cm there was no evidence of urethral diverticulum. Mild trabeculation noted.    Urinary Symptoms: Leaks urine with cough/ sneeze, laughing, exercise, lifting, and during sex Leakage depends on how active Pad use: only with exercise She is bothered by her UI symptoms. She has not had treatment previously.  Had urodynamic testing performed at [facility]- see above.   Day time voids: every hour.  Nocturia: 1-2 times per night to void. Voiding dysfunction: she does not empty her bladder well.   does not use a catheter to empty bladder.  When urinating, she feels she has no difficulties  UTIs:  0  UTI's in the last year.   Denies history of blood in urine and kidney or bladder stones  Pelvic Organ Prolapse Symptoms:                  She Admits to a feeling of a bulge the vaginal area. It has been present for 15 years.  She Admits to seeing a bulge.  This bulge is bothersome.  Bowel Symptom: Bowel movements: 1 time(s) per day Stool consistency: soft  Straining: yes.  Splinting: yes.  Incomplete evacuation: no.  She Denies accidental bowel leakage / fecal incontinence Bowel regimen: none   Sexual Function Sexually active: yes.  Sexual orientation: Straight Pain with sex: No  Pelvic Pain Denies pelvic pain   Past Medical History:  Past Medical History:  Diagnosis Date   ASCUS of cervix with negative high risk HPV on 10/29/2021 10/29/2021   History of abnormal cervical Pap smear    Thyroid disease    hashimoto's, goiter     Past Surgical History:   Past Surgical History:  Procedure Laterality Date   INTRAUTERINE DEVICE (IUD) INSERTION  07/19/2012   Mirena   THYROID SURGERY  06/25/2009   bilateral FNA     Past OB/GYN History: OB History  Gravida Para Term Preterm AB Living  1 1 1     1   SAB IAB Ectopic Multiple Live Births          1    #  Outcome Date GA Lbr Len/2nd Weight Sex Delivery Anes PTL Lv  1 Term 05/31/07   8 lb 15 oz (4.054 kg)  Vag-Spont   LIV    Contraception: Mirena IUD. Last pap smear was 10/29/21- ASCUS, negative HPV.  Recommended repeat co-testing in 1 year   Medications: She has a current medication list which includes the following prescription(s): bupropion, levonorgestrel, levothyroxine, phentermine, and semaglutide-weight management.   Allergies: Patient has No Known Allergies.   Social History:  Social History   Tobacco Use   Smoking status: Never    Passive exposure: Never   Smokeless tobacco: Never  Vaping Use    Vaping Use: Never used  Substance Use Topics   Alcohol use: Yes    Alcohol/week: 4.0 standard drinks of alcohol    Types: 4 Cans of beer per week   Drug use: No    Relationship status: married She lives with husband and daughter.   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 Regular exercise: No History of abuse: No  Family History:   Family History  Problem Relation Age of Onset   Hypertension Mother    Diabetes Father    Hypertension Father    Heart attack Father    Multiple sclerosis Sister    Ovarian cancer Paternal Aunt    Lymphoma Paternal Grandmother    Lung cancer Paternal Grandfather      Review of Systems: Review of Systems  Constitutional:  Negative for fever, malaise/fatigue and weight loss.  Respiratory:  Negative for cough, shortness of breath and wheezing.   Cardiovascular:  Negative for chest pain, palpitations and leg swelling.  Gastrointestinal:  Negative for abdominal pain and blood in stool.  Genitourinary:  Negative for dysuria.  Musculoskeletal:  Negative for myalgias.  Skin:  Negative for rash.  Neurological:  Positive for headaches. Negative for dizziness.  Endo/Heme/Allergies:  Does not bruise/bleed easily.  Psychiatric/Behavioral:  Negative for depression. The patient is not nervous/anxious.      OBJECTIVE Physical Exam: Vitals:   11/20/22 1337  BP: 112/79  Pulse: 97  Weight: 173 lb 3.2 oz (78.6 kg)  Height: 5' 4.5" (1.638 m)    Physical Exam Constitutional:      General: She is not in acute distress. Pulmonary:     Effort: Pulmonary effort is normal.  Abdominal:     General: There is no distension.     Palpations: Abdomen is soft.     Tenderness: There is no abdominal tenderness. There is no rebound.  Musculoskeletal:        General: No swelling. Normal range of motion.  Skin:    General: Skin is warm and dry.     Findings: No rash.  Neurological:     Mental Status: She is alert and oriented to person, place, and time.   Psychiatric:        Mood and Affect: Mood normal.        Behavior: Behavior normal.      GU / Detailed Urogynecologic Evaluation:  Pelvic Exam: Normal external female genitalia; Bartholin's and Skene's glands normal in appearance; urethral meatus normal in appearance, no urethral masses or discharge.   CST: negative  Speculum exam reveals normal vaginal mucosa without atrophy. Cervix normal appearance. Uterus normal single, nontender. Adnexa no mass, fullness, tenderness.     Pelvic floor strength II/V  Pelvic floor musculature: Right levator non-tender, Right obturator non-tender, Left levator non-tender, Left obturator non-tender  POP-Q:   POP-Q  0  Aa   0                                           Ba  -4                                              C   5                                            Gh  3                                            Pb  9                                            tvl   0                                            Ap  0                                            Bp  -7.5                                              D      Rectal Exam:  Normal external rectum  Post-Void Residual (PVR) by Bladder Scan: In order to evaluate bladder emptying, we discussed obtaining a postvoid residual and she agreed to this procedure.  Procedure: The ultrasound unit was placed on the patient's abdomen in the suprapubic region after the patient had voided. A PVR of 23 ml was obtained by bladder scan.  Laboratory Results: POC urine: trace blood, otherwise negative   ASSESSMENT AND PLAN Patricia Mendez is a 41 y.o. with:  1. Prolapse of anterior vaginal wall   2. Prolapse of posterior vaginal wall   3. Uterovaginal prolapse, incomplete   4. Atypical squamous cells of undetermined significance on cytologic smear of cervix (ASC-US)   5. SUI (stress urinary incontinence, female)   6. Urinary frequency     Stage II anterior, Stage II posterior, Stage I apical prolapse - For treatment of pelvic organ prolapse, we discussed options for management including expectant management, conservative management, and surgical management, such as Kegels, a pessary, pelvic floor physical therapy, and specific surgical procedures. - We discussed two options for prolapse repair:  1) vaginal repair without mesh - Pros - safer, no mesh complications - Cons - not as strong as mesh repair, higher risk of recurrence  2) laparoscopic repair with mesh - Pros -  stronger, better long-term success - Cons - risks of mesh implant (erosion into vagina or bladder, adhering to the rectum, pain) - these risks are lower than with a vaginal mesh but still exist - She is interested in Robotic TLH with sacrocolpopexy. Will notify surgery scheduler.  - Last pap with ASCUS a year ago. Is due for repeat. She will plan to have this done prior to planned surgery .   2. SUI - For treatment of stress urinary incontinence,  non-surgical options include expectant management, weight loss, physical therapy, as well as a pessary.  Surgical options include a midurethral sling, Burch urethropexy, and transurethral injection of a bulking agent. - We discussed the results of her urodynamic testing and I reviewed the scanned images of the study in Media. Has significant voiding dysfunction, mainly valsalva voids with little detrusor function. Therefore, do not recommend sling as this can increase risk of voiding dysfunction and retention. Recommended urethral bulking, which can be done at the time of prolapse repair.   Request sent for surgery scheduling. Return for pre op  Marguerita BeardsMichelle N Verbon Giangregorio, MD

## 2022-12-03 ENCOUNTER — Other Ambulatory Visit (HOSPITAL_COMMUNITY): Payer: Self-pay

## 2022-12-04 ENCOUNTER — Other Ambulatory Visit: Payer: Self-pay

## 2022-12-10 ENCOUNTER — Other Ambulatory Visit: Payer: Self-pay

## 2022-12-14 ENCOUNTER — Ambulatory Visit (INDEPENDENT_AMBULATORY_CARE_PROVIDER_SITE_OTHER): Payer: 59 | Admitting: Obstetrics and Gynecology

## 2022-12-14 ENCOUNTER — Other Ambulatory Visit (HOSPITAL_COMMUNITY)
Admission: RE | Admit: 2022-12-14 | Discharge: 2022-12-14 | Disposition: A | Discharge status: Home / Self Care | Payer: 59 | Source: Ambulatory Visit | Attending: Obstetrics and Gynecology | Admitting: Obstetrics and Gynecology

## 2022-12-14 ENCOUNTER — Encounter: Payer: Self-pay | Admitting: Obstetrics and Gynecology

## 2022-12-14 VITALS — BP 117/78 | HR 98 | Ht 65.0 in | Wt 173.0 lb

## 2022-12-14 DIAGNOSIS — Z01419 Encounter for gynecological examination (general) (routine) without abnormal findings: Secondary | ICD-10-CM | POA: Diagnosis not present

## 2022-12-14 NOTE — Progress Notes (Signed)
Pt here for pap smear only.

## 2022-12-14 NOTE — Progress Notes (Signed)
Subjective:     Patricia Mendez is a 42 y.o. female P1 with BMI 28 is here for a comprehensive physical exam. The patient reports no problems. Patient has amenorrhea secondary to Mirena IUD. She has urinary incontinence and is scheduled for surgery in March. She is without any other complaints. She is overdue on mammogram and has missed a follow up appointment.   Past Medical History:  Diagnosis Date   ASCUS of cervix with negative high risk HPV on 10/29/2021 10/29/2021   History of abnormal cervical Pap smear    Thyroid disease    hashimoto's, goiter   Past Surgical History:  Procedure Laterality Date   INTRAUTERINE DEVICE (IUD) INSERTION  07/19/2012   Mirena   THYROID SURGERY  06/25/2009   bilateral FNA   Family History  Problem Relation Age of Onset   Hypertension Mother    Diabetes Father    Hypertension Father    Heart attack Father    Multiple sclerosis Sister    Ovarian cancer Paternal Aunt    Lymphoma Paternal Grandmother    Lung cancer Paternal Grandfather     Social History   Socioeconomic History   Marital status: Married    Spouse name: Not on file   Number of children: 1   Years of education: Not on file   Highest education level: Not on file  Occupational History   Occupation: Catering manager  Tobacco Use   Smoking status: Never    Passive exposure: Never   Smokeless tobacco: Never  Vaping Use   Vaping Use: Never used  Substance and Sexual Activity   Alcohol use: Yes    Alcohol/week: 4.0 standard drinks of alcohol    Types: 4 Cans of beer per week   Drug use: No   Sexual activity: Yes    Partners: Male    Birth control/protection: I.U.D.  Other Topics Concern   Not on file  Social History Narrative   Married since 2004.Lives with husband and daughter.Clinical Engineer, manufacturing systems.College,Masters in Nursing ,RN previously.   Social Determinants of Health   Financial Resource Strain: Not on file  Food Insecurity: Not on file   Transportation Needs: Not on file  Physical Activity: Not on file  Stress: Not on file  Social Connections: Not on file  Intimate Partner Violence: Not on file   Health Maintenance  Topic Date Due   DTaP/Tdap/Td (2 - Tdap) 11/24/2019   COVID-19 Vaccine (3 - 2023-24 season) 07/24/2022   INFLUENZA VACCINE  02/21/2023 (Originally 06/23/2022)   PAP SMEAR-Modifier  10/29/2024   Hepatitis C Screening  Completed   HIV Screening  Completed   HPV VACCINES  Aged Out       Review of Systems Pertinent items noted in HPI and remainder of comprehensive ROS otherwise negative.   Objective:  Blood pressure 117/78, pulse 98, height 5\' 5"  (1.651 m), weight 173 lb (78.5 kg).   GENERAL: Well-developed, well-nourished female in no acute distress.  HEENT: Normocephalic, atraumatic. Sclerae anicteric.  NECK: Supple. Normal thyroid.  LUNGS: Clear to auscultation bilaterally.  HEART: Regular rate and rhythm. BREASTS: Symmetric in size. No palpable masses or lymphadenopathy, skin changes, or nipple drainage. ABDOMEN: Soft, nontender, nondistended. No organomegaly. PELVIC: Normal external female genitalia. Vagina is pink and rugated.  Normal discharge. Normal appearing cervix with IUD strings visualized at the external os. Uterus is normal in size. No adnexal mass or tenderness. Chaperone present during the pelvic exam EXTREMITIES: No cyanosis, clubbing, or edema, 2+  distal pulses.     Assessment:    Healthy female exam.      Plan:    Pap smear collected Screening mammogram ordered Patient will be contacted with abnormal results See After Visit Summary for Counseling Recommendations

## 2022-12-15 ENCOUNTER — Other Ambulatory Visit (HOSPITAL_COMMUNITY): Payer: Self-pay

## 2022-12-15 ENCOUNTER — Other Ambulatory Visit: Payer: Self-pay

## 2022-12-21 LAB — CYTOLOGY - PAP: Diagnosis: NEGATIVE

## 2022-12-30 ENCOUNTER — Encounter: Payer: Self-pay | Admitting: Obstetrics and Gynecology

## 2023-01-01 NOTE — Progress Notes (Signed)
Patricia Mendez is a 42 y.o. female had FMLA paper faxed to the [place] from [employer] Pt notified there will be a 10-14 day turn around for forms to be ready.  Pt notified  she will be contacted as soon as the from are ready and have been faxed.  Pre-op date: 01/26/23 Surgery date: 02/15/23 Post op date:03/23/23

## 2023-01-11 ENCOUNTER — Encounter: Payer: Self-pay | Admitting: *Deleted

## 2023-01-13 NOTE — Progress Notes (Signed)
Pt has been notified her FMLA forms have been faxed to [employer] at [phone]  Forms have been scanned in media.

## 2023-01-26 ENCOUNTER — Encounter: Payer: 59 | Admitting: Obstetrics and Gynecology

## 2023-02-02 ENCOUNTER — Encounter (HOSPITAL_BASED_OUTPATIENT_CLINIC_OR_DEPARTMENT_OTHER): Payer: Self-pay | Admitting: Obstetrics and Gynecology

## 2023-02-04 ENCOUNTER — Encounter: Payer: Self-pay | Admitting: Family Medicine

## 2023-02-05 ENCOUNTER — Other Ambulatory Visit (HOSPITAL_COMMUNITY): Payer: Self-pay

## 2023-02-05 ENCOUNTER — Ambulatory Visit (INDEPENDENT_AMBULATORY_CARE_PROVIDER_SITE_OTHER): Payer: 59 | Admitting: Obstetrics and Gynecology

## 2023-02-05 ENCOUNTER — Encounter: Payer: Self-pay | Admitting: Obstetrics and Gynecology

## 2023-02-05 ENCOUNTER — Other Ambulatory Visit: Payer: Self-pay

## 2023-02-05 VITALS — BP 108/69 | HR 101 | Wt 171.6 lb

## 2023-02-05 DIAGNOSIS — N812 Incomplete uterovaginal prolapse: Secondary | ICD-10-CM

## 2023-02-05 DIAGNOSIS — N393 Stress incontinence (female) (male): Secondary | ICD-10-CM

## 2023-02-05 DIAGNOSIS — Z01818 Encounter for other preprocedural examination: Secondary | ICD-10-CM

## 2023-02-05 DIAGNOSIS — N816 Rectocele: Secondary | ICD-10-CM

## 2023-02-05 DIAGNOSIS — N8110 Cystocele, unspecified: Secondary | ICD-10-CM

## 2023-02-05 MED ORDER — IBUPROFEN 600 MG PO TABS
600.0000 mg | ORAL_TABLET | Freq: Four times a day (QID) | ORAL | 0 refills | Status: DC | PRN
  Filled 2023-02-05: qty 30, 8d supply, fill #0

## 2023-02-05 MED ORDER — ACETAMINOPHEN 500 MG PO TABS
500.0000 mg | ORAL_TABLET | Freq: Four times a day (QID) | ORAL | 0 refills | Status: DC | PRN
  Filled 2023-02-05: qty 30, 8d supply, fill #0

## 2023-02-05 MED ORDER — POLYETHYLENE GLYCOL 3350 17 GM/SCOOP PO POWD
17.0000 g | Freq: Every day | ORAL | 0 refills | Status: DC
  Filled 2023-02-05: qty 238, 14d supply, fill #0

## 2023-02-05 MED ORDER — OXYCODONE HCL 5 MG PO TABS
5.0000 mg | ORAL_TABLET | ORAL | 0 refills | Status: DC | PRN
  Filled 2023-02-05: qty 15, 3d supply, fill #0

## 2023-02-05 NOTE — Progress Notes (Signed)
[facility] Pre-Operative H&P  Subjective Chief Complaint: Patricia Mendez presents for a preoperative encounter.   History of Present Illness: Patricia Mendez is a 42 y.o. female who presents for preoperative visit.  She is scheduled to undergo Robotic assisted total laparoscopic hysterectomy, bilateral salpingectomy, sacrocolpopexy, possible posterior repair and perineorrhaphy, cystoscopy, urethral bulking on 02/15/23.  Her symptoms include vaginal bulge and incontinence, and she was was found to have Stage II anterior, Stage II posterior, Stage I apical prolapse.   Urodynamics showed: Maximum bladder capacity was 460 mL. Bladder was stable. Her Valsalva leak point pressure at 300 mL was 90 cm of water with mild leakage and the prolapse reduced. Her cough leak point pressure at 400 mL was 96 cm of water with severe leak each. Valsalva leak point pressure at 400 mL was 76 cm of water with high-volume leakage and no reduction of prolapse. Stress incontinence was with and without prolapse reduced. During voiding she voided 459 mL with a maximal flow of 5 mils per second. She had a prolonged interrupted pattern as noted. She was doing a lot of abdominal straining.  repeat pap smear 12/14/22 negative  Past Medical History:  Diagnosis Date   ASCUS of cervix with negative high risk HPV on 10/29/2021 10/29/2021   Concussion 05/26/2021   From softball to face   History of abnormal cervical Pap smear    SUI (stress urinary incontinence, female) 2023   Thyroid disease    hashimoto's, goiter     Past Surgical History:  Procedure Laterality Date   INTRAUTERINE DEVICE (IUD) INSERTION  07/19/2012   Mirena   THYROID SURGERY  06/25/2009   bilateral FNA    has No Known Allergies.   Family History  Problem Relation Age of Onset   Hypertension Mother    Diabetes Father    Hypertension Father    Heart attack Father    Multiple sclerosis Sister    Ovarian cancer Paternal Aunt     Lymphoma Paternal Grandmother    Lung cancer Paternal Grandfather     Social History   Tobacco Use   Smoking status: Never    Passive exposure: Never   Smokeless tobacco: Never  Vaping Use   Vaping Use: Never used  Substance Use Topics   Alcohol use: Yes    Alcohol/week: 4.0 standard drinks of alcohol    Types: 4 Cans of beer per week   Drug use: No     Review of Systems was negative for a full 10 system review except as noted in the History of Present Illness.   Current Outpatient Medications:    buPROPion (WELLBUTRIN XL) 300 MG 24 hr tablet, Take 1 tablet (300 mg total) by mouth daily., Disp: 90 tablet, Rfl: 3   levonorgestrel (MIRENA) 20 MCG/24HR IUD, 1 Intra Uterine Device (1 each total) by Intrauterine route once., Disp: 1 each, Rfl: 0   levothyroxine (SYNTHROID) 150 MCG tablet, Take 1 tablet by mouth daily., Disp: 90 tablet, Rfl: 0   phentermine 37.5 MG capsule, Take 1 capsule (37.5 mg total) by mouth every morning., Disp: 90 capsule, Rfl: 0   Semaglutide-Weight Management 0.25 MG/0.5ML SOAJ, Inject 0.25 mg into the skin once a week. (Patient not taking: Reported on 11/20/2022), Disp: 2 mL, Rfl: 0   Objective There were no vitals filed for this visit.  Gen: NAD CV: S1 S2 RRR Lungs: Clear to auscultation bilaterally Abd: soft, nontender   Previous Pelvic Exam showed: POP-Q (11/20/22)   0  Aa   0                                           Ba   -4                                              C    5                                            Gh   3                                            Pb   9                                            tvl    0                                            Ap   0                                            Bp   -7.5                                              D         Assessment/ Plan  Assessment: The patient is a 42 y.o. year old scheduled to undergo obotic assisted  total laparoscopic hysterectomy, bilateral salpingectomy, sacrocolpopexy, possible posterior repair and perineorrhaphy, cystoscopy, urethral bulking. Verbal consent was obtained for these procedures.  Plan: General Surgical Consent: The patient has previously been counseled on alternative treatments, and the decision by the patient and provider was to proceed with the procedure listed above.  For all procedures, there are risks of bleeding, infection, damage to surrounding organs including but not limited to bowel, bladder, blood vessels, ureters and nerves, and need for further surgery if an injury were to occur. These risks are all low with minimally invasive surgery.   There are risks of numbness and weakness at any body site or buttock/rectal pain.  It is possible that baseline pain can be worsened by surgery, either with or without mesh. If surgery is vaginal, there is also a low risk of possible conversion to laparoscopy or open abdominal incision where indicated. Very rare risks include blood transfusion, blood clot, heart attack, pneumonia, or death.   There is also a risk of short-term postoperative urinary retention with need to use a catheter. About half of patients need to go home from surgery  with a catheter, which is then later removed in the office. The risk of long-term need for a catheter is very low. There is also a risk of worsening of overactive bladder.    Prolapse (with or without mesh): Risk factors for surgical failure  include things that put pressure on your pelvis and the surgical repair, including obesity, chronic cough, and heavy lifting or straining (including lifting children or adults, straining on the toilet, or lifting heavy objects such as furniture or anything weighing >25 lbs. Risks of recurrence is 20-30% with vaginal native tissue repair and a less than 10% with sacrocolpopexy with mesh.    Sacrocolpopexy: Mesh implants may provide more prolapse support, but do  have some unique risks to consider. It is important to understand that mesh is permanent and cannot be easily removed. Risks of abdominal sacrocolpopexy mesh include mesh exposure (~3-6%), painful intercourse (recent studies show lower rates after surgery compared to before, with ~5-8% risk of new onset), and very rare risks of bowel or bladder injury or infection (<1%). The risk of mesh exposure is more likely in a woman with risks for poor healing (prior radiation, poorly controlled diabetes, or immunocompromised). The risk of new or worsened chronic pain after mesh implant is more common in women with baseline chronic pain and/or poorly controlled anxiety or depression. There is an FDA safety notification on vaginal mesh procedures for prolapse but NOT abdominal mesh procedures and therefore does not apply to your surgery. We have extensive experience and training with mesh placement and we have close postoperative follow up to identify any potential complications from mesh.    We discussed consent for blood products. Risks for blood transfusion include allergic reactions, other reactions that can affect different body organs and managed accordingly, transmission of infectious diseases such as HIV or Hepatitis. However, the blood is screened. Patient consents for blood products.  Pre-operative instructions:  She was instructed to not take Aspirin/NSAIDs x 7days prior to surgery.  Antibiotic prophylaxis was ordered as indicated.  Catheter use: Patient will go home with foley if needed after post-operative voiding trial.  Post-operative instructions:  She was provided with specific post-operative instructions, including precautions and signs/symptoms for which we would recommend contacting us, in addition to daytime and after-hours contact phone numbers. This was provided on a handout.   Post-operative medications: Prescriptions for motrin, tylenol, miralax, and oxycodone were sent to her pharmacy.  Discussed using ibuprofen and tylenol on a schedule to limit use of narcotics.   Laboratory testing:  CBC and Type and Screen  Preoperative clearance:  She does not require surgical clearance.    Post-operative follow-up:  A post-operative appointment will be made for 6 weeks from the date of surgery. If she needs a post-operative nurse visit for a voiding trial, that will be set up after she leaves the hospital.    Patient will call the clinic or use MyChart should anything change or any new issues arise.   Jaquita Folds, MD  Time spent: I spent 20 minutes dedicated to the care of this patient on the date of this encounter to include pre-visit review of records, face-to-face time with the patient and post visit documentation and ordering medication/ testing.

## 2023-02-05 NOTE — H&P (View-Only) (Signed)
[facility] Pre-Operative H&P  Subjective Chief Complaint: Patricia Mendez presents for a preoperative encounter.   History of Present Illness: Patricia Mendez is a 42 y.o. female who presents for preoperative visit.  She is scheduled to undergo Robotic assisted total laparoscopic hysterectomy, bilateral salpingectomy, sacrocolpopexy, possible posterior repair and perineorrhaphy, cystoscopy, urethral bulking on 02/15/23.  Her symptoms include vaginal bulge and incontinence, and she was was found to have Stage II anterior, Stage II posterior, Stage I apical prolapse.   Urodynamics showed: Maximum bladder capacity was 460 mL. Bladder was stable. Her Valsalva leak point pressure at 300 mL was 90 cm of water with mild leakage and the prolapse reduced. Her cough leak point pressure at 400 mL was 96 cm of water with severe leak each. Valsalva leak point pressure at 400 mL was 76 cm of water with high-volume leakage and no reduction of prolapse. Stress incontinence was with and without prolapse reduced. During voiding she voided 459 mL with a maximal flow of 5 mils per second. She had a prolonged interrupted pattern as noted. She was doing a lot of abdominal straining.  repeat pap smear 12/14/22 negative  Past Medical History:  Diagnosis Date   ASCUS of cervix with negative high risk HPV on 10/29/2021 10/29/2021   Concussion 05/26/2021   From softball to face   History of abnormal cervical Pap smear    SUI (stress urinary incontinence, female) 2023   Thyroid disease    hashimoto's, goiter     Past Surgical History:  Procedure Laterality Date   INTRAUTERINE DEVICE (IUD) INSERTION  07/19/2012   Mirena   THYROID SURGERY  06/25/2009   bilateral FNA    has No Known Allergies.   Family History  Problem Relation Age of Onset   Hypertension Mother    Diabetes Father    Hypertension Father    Heart attack Father    Multiple sclerosis Sister    Ovarian cancer Paternal Aunt     Lymphoma Paternal Grandmother    Lung cancer Paternal Grandfather     Social History   Tobacco Use   Smoking status: Never    Passive exposure: Never   Smokeless tobacco: Never  Vaping Use   Vaping Use: Never used  Substance Use Topics   Alcohol use: Yes    Alcohol/week: 4.0 standard drinks of alcohol    Types: 4 Cans of beer per week   Drug use: No     Review of Systems was negative for a full 10 system review except as noted in the History of Present Illness.   Current Outpatient Medications:    buPROPion (WELLBUTRIN XL) 300 MG 24 hr tablet, Take 1 tablet (300 mg total) by mouth daily., Disp: 90 tablet, Rfl: 3   levonorgestrel (MIRENA) 20 MCG/24HR IUD, 1 Intra Uterine Device (1 each total) by Intrauterine route once., Disp: 1 each, Rfl: 0   levothyroxine (SYNTHROID) 150 MCG tablet, Take 1 tablet by mouth daily., Disp: 90 tablet, Rfl: 0   phentermine 37.5 MG capsule, Take 1 capsule (37.5 mg total) by mouth every morning., Disp: 90 capsule, Rfl: 0   Semaglutide-Weight Management 0.25 MG/0.5ML SOAJ, Inject 0.25 mg into the skin once a week. (Patient not taking: Reported on 11/20/2022), Disp: 2 mL, Rfl: 0   Objective There were no vitals filed for this visit.  Gen: NAD CV: S1 S2 RRR Lungs: Clear to auscultation bilaterally Abd: soft, nontender   Previous Pelvic Exam showed: POP-Q (11/20/22)   0  Aa   0                                           Ba   -4                                              C    5                                            Gh   3                                            Pb   9                                            tvl    0                                            Ap   0                                            Bp   -7.5                                              D         Assessment/ Plan  Assessment: The patient is a 42 y.o. year old scheduled to undergo obotic assisted  total laparoscopic hysterectomy, bilateral salpingectomy, sacrocolpopexy, possible posterior repair and perineorrhaphy, cystoscopy, urethral bulking. Verbal consent was obtained for these procedures.  Plan: General Surgical Consent: The patient has previously been counseled on alternative treatments, and the decision by the patient and provider was to proceed with the procedure listed above.  For all procedures, there are risks of bleeding, infection, damage to surrounding organs including but not limited to bowel, bladder, blood vessels, ureters and nerves, and need for further surgery if an injury were to occur. These risks are all low with minimally invasive surgery.   There are risks of numbness and weakness at any body site or buttock/rectal pain.  It is possible that baseline pain can be worsened by surgery, either with or without mesh. If surgery is vaginal, there is also a low risk of possible conversion to laparoscopy or open abdominal incision where indicated. Very rare risks include blood transfusion, blood clot, heart attack, pneumonia, or death.   There is also a risk of short-term postoperative urinary retention with need to use a catheter. About half of patients need to go home from surgery  with a catheter, which is then later removed in the office. The risk of long-term need for a catheter is very low. There is also a risk of worsening of overactive bladder.    Prolapse (with or without mesh): Risk factors for surgical failure  include things that put pressure on your pelvis and the surgical repair, including obesity, chronic cough, and heavy lifting or straining (including lifting children or adults, straining on the toilet, or lifting heavy objects such as furniture or anything weighing >25 lbs. Risks of recurrence is 20-30% with vaginal native tissue repair and a less than 10% with sacrocolpopexy with mesh.    Sacrocolpopexy: Mesh implants may provide more prolapse support, but do  have some unique risks to consider. It is important to understand that mesh is permanent and cannot be easily removed. Risks of abdominal sacrocolpopexy mesh include mesh exposure (~3-6%), painful intercourse (recent studies show lower rates after surgery compared to before, with ~5-8% risk of new onset), and very rare risks of bowel or bladder injury or infection (<1%). The risk of mesh exposure is more likely in a woman with risks for poor healing (prior radiation, poorly controlled diabetes, or immunocompromised). The risk of new or worsened chronic pain after mesh implant is more common in women with baseline chronic pain and/or poorly controlled anxiety or depression. There is an FDA safety notification on vaginal mesh procedures for prolapse but NOT abdominal mesh procedures and therefore does not apply to your surgery. We have extensive experience and training with mesh placement and we have close postoperative follow up to identify any potential complications from mesh.    We discussed consent for blood products. Risks for blood transfusion include allergic reactions, other reactions that can affect different body organs and managed accordingly, transmission of infectious diseases such as HIV or Hepatitis. However, the blood is screened. Patient consents for blood products.  Pre-operative instructions:  She was instructed to not take Aspirin/NSAIDs x 7days prior to surgery.  Antibiotic prophylaxis was ordered as indicated.  Catheter use: Patient will go home with foley if needed after post-operative voiding trial.  Post-operative instructions:  She was provided with specific post-operative instructions, including precautions and signs/symptoms for which we would recommend contacting us, in addition to daytime and after-hours contact phone numbers. This was provided on a handout.   Post-operative medications: Prescriptions for motrin, tylenol, miralax, and oxycodone were sent to her pharmacy.  Discussed using ibuprofen and tylenol on a schedule to limit use of narcotics.   Laboratory testing:  CBC and Type and Screen  Preoperative clearance:  She does not require surgical clearance.    Post-operative follow-up:  A post-operative appointment will be made for 6 weeks from the date of surgery. If she needs a post-operative nurse visit for a voiding trial, that will be set up after she leaves the hospital.    Patient will call the clinic or use MyChart should anything change or any new issues arise.   Jaquita Folds, MD  Time spent: I spent 20 minutes dedicated to the care of this patient on the date of this encounter to include pre-visit review of records, face-to-face time with the patient and post visit documentation and ordering medication/ testing.

## 2023-02-08 ENCOUNTER — Other Ambulatory Visit: Payer: Self-pay

## 2023-02-08 ENCOUNTER — Encounter: Payer: Self-pay | Admitting: Family Medicine

## 2023-02-08 ENCOUNTER — Other Ambulatory Visit: Payer: Self-pay | Admitting: Family Medicine

## 2023-02-08 ENCOUNTER — Encounter (HOSPITAL_BASED_OUTPATIENT_CLINIC_OR_DEPARTMENT_OTHER): Payer: Self-pay | Admitting: Obstetrics and Gynecology

## 2023-02-08 DIAGNOSIS — E039 Hypothyroidism, unspecified: Secondary | ICD-10-CM

## 2023-02-08 NOTE — Progress Notes (Signed)
Spoke w/ via phone for pre-op interview---[facility] needs dos----urine pregnancy per anesthesia, surgeon orders pending as of 02/08/23               Lab results------02/10/23 lab appt for cbc, type & screen COVID test -----patient states asymptomatic no test needed Arrive at -------0530 on Monday, 02/15/23 NPO after MN NO Solid Food.  Clear liquids from MN until---0430 Med rec completed Medications to take morning of surgery -----Wellbutrin, Synthroid (Patient instructed to stop taking Phentermine on 02/08/23.) Diabetic medication -----n/a Patient instructed no nail polish to be worn day of surgery Patient instructed to bring photo id and insurance card day of surgery Patient aware to have Driver (ride ) / caregiver    for 24 hours after surgery - husband, Randall Hiss Patient Special Instructions -----Extended / overnight stay instructions given. Pre-Op special Istructions -----Requested orders from Dr. Wannetta Sender via Epic IB on 02/02/23. Patient verbalized understanding of instructions that were given at this phone interview. Patient denies shortness of breath, chest pain, fever, cough at this phone interview.

## 2023-02-08 NOTE — Progress Notes (Signed)
Your procedure is scheduled on Monday, 02/15/23.  Report to [facility] AT  5:30 AM.   Call this number if you have problems the morning of surgery  [phone].   OUR ADDRESS IS [address].  WE ARE LOCATED IN [facility].  PLEASE BRING YOUR INSURANCE CARD AND PHOTO ID DAY OF SURGERY.  ONLY 2 PEOPLE ARE ALLOWED IN  WAITING  ROOM / CURRENTLY NO ONE UNDER AGE 43                                     REMEMBER:  DO NOT EAT FOOD, CANDY GUM OR MINTS  AFTER MIDNIGHT THE NIGHT BEFORE YOUR SURGERY . YOU MAY HAVE CLEAR LIQUIDS FROM MIDNIGHT THE NIGHT BEFORE YOUR SURGERY UNTIL  4:30 AM. NO CLEAR LIQUIDS AFTER   4:30 AM DAY OF SURGERY.  YOU MAY  BRUSH YOUR TEETH MORNING OF SURGERY AND RINSE YOUR MOUTH OUT, NO CHEWING GUM CANDY OR MINTS.     CLEAR LIQUID DIET    Allowed      Water                                                                   Coffee and tea, regular and decaf  (NO cream or milk products of any type, may sweeten)                         Carbonated beverages, regular and diet                                    Sports drinks like Gatorade _____________________________________________________________________     TAKE ONLY THESE MEDICATIONS MORNING OF SURGERY: Wellbutrin, Synthroid Stop taking Phentermine as of 02/09/23.    UP TO 4 VISITORS  MAY VISIT IN THE EXTENDED RECOVERY ROOM UNTIL 800 PM ONLY.  ONE  VISITOR AGE 71 AND OVER MAY SPEND THE NIGHT AND MUST BE IN EXTENDED RECOVERY ROOM NO LATER THAN 800 PM . YOUR DISCHARGE TIME AFTER YOU SPEND THE NIGHT IS 900 AM THE MORNING AFTER YOUR SURGERY.  YOU MAY PACK A SMALL OVERNIGHT BAG WITH TOILETRIES FOR YOUR OVERNIGHT STAY IF YOU WISH.  YOUR PRESCRIPTION MEDICATIONS WILL BE PROVIDED DURING [facility].                                      DO NOT WEAR JEWERLY/  METAL/  PIERCINGS (INCLUDING NO PLASTIC PIERCINGS) DO NOT WEAR LOTIONS, POWDERS, PERFUMES OR NAIL POLISH ON YOUR  FINGERNAILS. TOENAIL POLISH IS OK TO WEAR. DO NOT SHAVE FOR 48 HOURS PRIOR TO DAY OF SURGERY.  CONTACTS, GLASSES, OR DENTURES MAY NOT BE WORN TO SURGERY.  REMEMBER: NO SMOKING, VAPING ,  DRUGS OR ALCOHOL FOR 24 HOURS BEFORE YOUR SURGERY.  [facility] IS NOT RESPONSIBLE  FOR ANY BELONGINGS.                                                                    Marland Kitchen           [facility] - Preparing for Surgery Before surgery, you can play an important role.  Because skin is not sterile, your skin needs to be as free of germs as possible.  You can reduce the number of germs on your skin by washing with CHG (chlorahexidine gluconate) soap before surgery.  CHG is an antiseptic cleaner which kills germs and bonds with the skin to continue killing germs even after washing. Please DO NOT use if you have an allergy to CHG or antibacterial soaps.  If your skin becomes reddened/irritated stop using the CHG and inform your nurse when you arrive at Short Stay. Do not shave (including legs and underarms) for at least 48 hours prior to the first CHG shower.  You may shave your face/neck. Please follow these instructions carefully:  1.  Shower with CHG Soap the night before surgery and the  morning of Surgery.  2.  If you choose to wash your hair, wash your hair first as usual with your  normal  shampoo.  3.  After you shampoo, rinse your hair and body thoroughly to remove the  shampoo.                                        4.  Use CHG as you would any other liquid soap.  You can apply chg directly  to the skin and wash , chg soap provided, night before and morning of your surgery.  5.  Apply the CHG Soap to your body ONLY FROM THE NECK DOWN.   Do not use on face/ open                           Wound or open sores. Avoid contact with eyes, ears mouth and genitals (private parts).                       Wash face,  Genitals (private parts) with your normal soap.             6.  Wash  thoroughly, paying special attention to the area where your surgery  will be performed.  7.  Thoroughly rinse your body with warm water from the neck down.  8.  DO NOT shower/wash with your normal soap after using and rinsing off  the CHG Soap.             9.  Pat yourself dry with a clean towel.            10.  Wear clean pajamas.            11.  Place clean sheets on your bed the night of your first shower and do not  sleep with pets. Day of Surgery : Do not apply any lotions/ powders the morning of surgery.  Please wear clean clothes to the hospital/surgery  center.  IF YOU HAVE ANY SKIN IRRITATION OR PROBLEMS WITH THE SURGICAL SOAP, PLEASE GET A BAR OF GOLD DIAL SOAP AND SHOWER THE NIGHT BEFORE YOUR SURGERY AND THE MORNING OF YOUR SURGERY. PLEASE LET THE NURSE KNOW MORNING OF YOUR SURGERY IF YOU HAD ANY PROBLEMS WITH THE SURGICAL SOAP.   ________________________________________________________________________                                                        QUESTIONS [facility] PRE OP NURSE PHONE [phone].

## 2023-02-09 DIAGNOSIS — E039 Hypothyroidism, unspecified: Secondary | ICD-10-CM | POA: Diagnosis not present

## 2023-02-10 ENCOUNTER — Encounter (HOSPITAL_COMMUNITY)
Admission: RE | Admit: 2023-02-10 | Discharge: 2023-02-10 | Disposition: A | Discharge status: Home / Self Care | Payer: 59 | Source: Ambulatory Visit | Attending: Obstetrics and Gynecology | Admitting: Obstetrics and Gynecology

## 2023-02-10 ENCOUNTER — Other Ambulatory Visit (HOSPITAL_COMMUNITY): Payer: Self-pay

## 2023-02-10 ENCOUNTER — Other Ambulatory Visit: Payer: Self-pay

## 2023-02-10 ENCOUNTER — Other Ambulatory Visit: Payer: Self-pay | Admitting: Family Medicine

## 2023-02-10 DIAGNOSIS — E039 Hypothyroidism, unspecified: Secondary | ICD-10-CM

## 2023-02-10 DIAGNOSIS — Z01812 Encounter for preprocedural laboratory examination: Secondary | ICD-10-CM | POA: Diagnosis not present

## 2023-02-10 DIAGNOSIS — Z01818 Encounter for other preprocedural examination: Secondary | ICD-10-CM

## 2023-02-10 LAB — CBC
HCT: 40 % (ref 36.0–46.0)
Hemoglobin: 13.4 g/dL (ref 12.0–15.0)
MCH: 30.4 pg (ref 26.0–34.0)
MCHC: 33.5 g/dL (ref 30.0–36.0)
MCV: 90.7 fL (ref 80.0–100.0)
Platelets: 295 10*3/uL (ref 150–400)
RBC: 4.41 MIL/uL (ref 3.87–5.11)
RDW: 12.6 % (ref 11.5–15.5)
WBC: 8.3 10*3/uL (ref 4.0–10.5)
nRBC: 0 % (ref 0.0–0.2)

## 2023-02-10 LAB — TSH+T4F+T3FREE
Free T4: 1.82 ng/dL — ABNORMAL HIGH (ref 0.82–1.77)
T3, Free: 2.6 pg/mL (ref 2.0–4.4)
TSH: 1.27 u[IU]/mL (ref 0.450–4.500)

## 2023-02-10 MED ORDER — LEVOTHYROXINE SODIUM 150 MCG PO TABS
150.0000 ug | ORAL_TABLET | Freq: Every day | ORAL | 0 refills | Status: DC
  Filled 2023-02-10: qty 90, 90d supply, fill #0

## 2023-02-10 NOTE — Progress Notes (Signed)
Thyroid remains stable.

## 2023-02-11 ENCOUNTER — Encounter (HOSPITAL_COMMUNITY): Payer: Self-pay | Admitting: Family Medicine

## 2023-02-11 ENCOUNTER — Other Ambulatory Visit (HOSPITAL_COMMUNITY): Payer: Self-pay | Admitting: Family Medicine

## 2023-02-11 DIAGNOSIS — N6310 Unspecified lump in the right breast, unspecified quadrant: Secondary | ICD-10-CM

## 2023-02-11 NOTE — Progress Notes (Signed)
I,J'ya E Hunter,acting as a scribe for Gwyneth Sprout, FNP.,have documented all relevant documentation on the behalf of Gwyneth Sprout, FNP,as directed by  Gwyneth Sprout, FNP while in the presence of Gwyneth Sprout, FNP.  Established patient visit  Patient: Patricia Mendez   DOB: [birth year]   42 y.o. Female  MRN: [id number] Visit Date: 02/12/2023  Today's healthcare provider: Gwyneth Sprout, FNP  Re Introduced to nurse practitioner role and practice setting.  All questions answered.  Discussed provider/patient relationship and expectations.  Chief Complaint  Patient presents with   Weight Management Screening   Subjective    HPI  Follow up for Weight Management  The patient was last seen for this 3 months ago. Changes made at last visit include phentermine and wellbutrin 300 xr qHS to RTC.  She reports excellent compliance with treatment. She feels that condition is Improved. She is not having side effects.   -----------------------------------------------------------------------------------------  Medications: Outpatient Medications Prior to Visit  Medication Sig   acetaminophen (TYLENOL) 500 MG tablet Take 1 tablet (500 mg total) by mouth every 6 (six) hours as needed (pain).   buPROPion (WELLBUTRIN XL) 300 MG 24 hr tablet Take 1 tablet (300 mg total) by mouth daily.   ibuprofen (ADVIL) 600 MG tablet Take 1 tablet (600 mg total) by mouth every 6 (six) hours as needed.   levonorgestrel (MIRENA) 20 MCG/24HR IUD 1 Intra Uterine Device (1 each total) by Intrauterine route once.   oxyCODONE (OXY IR/ROXICODONE) 5 MG immediate release tablet Take 1 tablet (5 mg total) by mouth every 4 (four) hours as needed for severe pain.   polyethylene glycol powder (GLYCOLAX/MIRALAX) 17 GM/SCOOP powder Take 17 g by mouth daily. Drink 17g (1 scoop) dissolved in water per day.   [DISCONTINUED] levothyroxine (SYNTHROID) 150 MCG tablet Take 1 tablet by mouth daily.   [DISCONTINUED] phentermine 37.5 MG  capsule Take 1 capsule (37.5 mg total) by mouth every morning.   No facility-administered medications prior to visit.    Review of Systems    Objective    BP 108/79 (BP Location: Right Arm, Patient Position: Sitting, Cuff Size: Large)   Pulse 79   Temp 98.9 F (37.2 C) (Oral)   Resp 14   Ht 5\' 5"  (1.651 m)   Wt 172 lb (78 kg)   SpO2 100%   BMI 28.62 kg/m  Wt Readings from Last 3 Encounters:  02/12/23 172 lb (78 kg)  02/10/23 169 lb (76.7 kg)  02/05/23 171 lb 9.6 oz (77.8 kg)   Physical Exam Vitals and nursing note reviewed.  Constitutional:      General: She is not in acute distress.    Appearance: Normal appearance. She is overweight. She is not ill-appearing, toxic-appearing or diaphoretic.  HENT:     Head: Normocephalic and atraumatic.  Cardiovascular:     Rate and Rhythm: Normal rate and regular rhythm.     Pulses: Normal pulses.     Heart sounds: Normal heart sounds. No murmur heard.    No friction rub. No gallop.  Pulmonary:     Effort: Pulmonary effort is normal. No respiratory distress.     Breath sounds: Normal breath sounds. No stridor. No wheezing, rhonchi or rales.  Chest:     Chest wall: No tenderness.  Musculoskeletal:        General: No swelling, tenderness, deformity or signs of injury. Normal range of motion.     Right lower leg: No edema.  Left lower leg: No edema.  Skin:    General: Skin is warm and dry.     Capillary Refill: Capillary refill takes less than 2 seconds.     Coloration: Skin is not jaundiced or pale.     Findings: No bruising, erythema, lesion or rash.  Neurological:     General: No focal deficit present.     Mental Status: She is alert and oriented to person, place, and time. Mental status is at baseline.     Cranial Nerves: No cranial nerve deficit.     Sensory: No sensory deficit.     Motor: No weakness.     Coordination: Coordination normal.  Psychiatric:        Mood and Affect: Mood normal.        Behavior: Behavior  normal.        Thought Content: Thought content normal.        Judgment: Judgment normal.     No results found for any visits on 02/12/23.  Assessment & Plan     Problem List Items Addressed This Visit       Endocrine   Acquired hypothyroidism - Primary    Chronic, stable 6 month labs reviewed per pt request Continue on previous done 150 mcg synthroid Reach back out for additional labs if s/s change      Relevant Medications   levothyroxine (SYNTHROID) 150 MCG tablet     Other   Overweight with body mass index (BMI) of 28 to 28.9 in adult    Remains overweight; improved slowly Has been off phentermine since Monday 02/08/23 in preparation for surgery next week on 02/15/23 Refill placed with note to resume following consult with specialist post op Patient contracted verbally for understanding Continue to recommend balanced, lower carb meals. Smaller meal size, adding snacks. Choosing water as drink of choice and increasing purposeful exercise. Body mass index is 28.62 kg/m.       Relevant Medications   phentermine 37.5 MG capsule   Return in about 3 months (around 05/15/2023) for weight.     Vonna Kotyk, FNP, have reviewed all documentation for this visit. The documentation on 02/12/23 for the exam, diagnosis, procedures, and orders are all accurate and complete.  Gwyneth Sprout, [place] [phone] (phone) [phone] (fax)  [place]

## 2023-02-12 ENCOUNTER — Encounter: Payer: Self-pay | Admitting: Family Medicine

## 2023-02-12 ENCOUNTER — Other Ambulatory Visit: Payer: Self-pay

## 2023-02-12 ENCOUNTER — Ambulatory Visit (INDEPENDENT_AMBULATORY_CARE_PROVIDER_SITE_OTHER): Payer: 59 | Admitting: Family Medicine

## 2023-02-12 ENCOUNTER — Other Ambulatory Visit (HOSPITAL_COMMUNITY): Payer: Self-pay

## 2023-02-12 VITALS — BP 108/79 | HR 79 | Temp 98.9°F | Resp 14 | Ht 65.0 in | Wt 172.0 lb

## 2023-02-12 DIAGNOSIS — E663 Overweight: Secondary | ICD-10-CM | POA: Insufficient documentation

## 2023-02-12 DIAGNOSIS — E039 Hypothyroidism, unspecified: Secondary | ICD-10-CM

## 2023-02-12 DIAGNOSIS — Z6828 Body mass index (BMI) 28.0-28.9, adult: Secondary | ICD-10-CM

## 2023-02-12 MED ORDER — PHENTERMINE HCL 37.5 MG PO CAPS
37.5000 mg | ORAL_CAPSULE | ORAL | 0 refills | Status: DC
  Filled 2023-02-12: qty 90, 90d supply, fill #0

## 2023-02-12 MED ORDER — LEVOTHYROXINE SODIUM 150 MCG PO TABS
150.0000 ug | ORAL_TABLET | Freq: Every day | ORAL | 3 refills | Status: DC
  Filled 2023-02-12 – 2023-04-02 (×2): qty 90, 90d supply, fill #0

## 2023-02-12 NOTE — Assessment & Plan Note (Signed)
Remains overweight; improved slowly Has been off phentermine since Monday 02/08/23 in preparation for surgery next week on 02/15/23 Refill placed with note to resume following consult with specialist post op Patient contracted verbally for understanding Continue to recommend balanced, lower carb meals. Smaller meal size, adding snacks. Choosing water as drink of choice and increasing purposeful exercise. Body mass index is 28.62 kg/m.

## 2023-02-12 NOTE — Assessment & Plan Note (Signed)
Chronic, stable 6 month labs reviewed per pt request Continue on previous done 150 mcg synthroid Reach back out for additional labs if s/s change

## 2023-02-15 ENCOUNTER — Ambulatory Visit (HOSPITAL_BASED_OUTPATIENT_CLINIC_OR_DEPARTMENT_OTHER): Payer: 59 | Admitting: Anesthesiology

## 2023-02-15 ENCOUNTER — Encounter (HOSPITAL_BASED_OUTPATIENT_CLINIC_OR_DEPARTMENT_OTHER): Payer: Self-pay | Admitting: Obstetrics and Gynecology

## 2023-02-15 ENCOUNTER — Ambulatory Visit (HOSPITAL_BASED_OUTPATIENT_CLINIC_OR_DEPARTMENT_OTHER)
Admission: RE | Admit: 2023-02-15 | Discharge: 2023-02-15 | Disposition: A | Discharge status: Home / Self Care | Payer: 59 | Source: Ambulatory Visit | Attending: Obstetrics and Gynecology | Admitting: Obstetrics and Gynecology

## 2023-02-15 ENCOUNTER — Telehealth: Payer: Self-pay | Admitting: Obstetrics and Gynecology

## 2023-02-15 ENCOUNTER — Encounter (HOSPITAL_BASED_OUTPATIENT_CLINIC_OR_DEPARTMENT_OTHER)
Admission: RE | Disposition: A | Discharge status: Home / Self Care | Payer: Self-pay | Source: Ambulatory Visit | Attending: Obstetrics and Gynecology

## 2023-02-15 ENCOUNTER — Other Ambulatory Visit: Payer: Self-pay

## 2023-02-15 DIAGNOSIS — D259 Leiomyoma of uterus, unspecified: Secondary | ICD-10-CM | POA: Insufficient documentation

## 2023-02-15 DIAGNOSIS — K660 Peritoneal adhesions (postprocedural) (postinfection): Secondary | ICD-10-CM

## 2023-02-15 DIAGNOSIS — N812 Incomplete uterovaginal prolapse: Secondary | ICD-10-CM

## 2023-02-15 DIAGNOSIS — Z79899 Other long term (current) drug therapy: Secondary | ICD-10-CM | POA: Insufficient documentation

## 2023-02-15 DIAGNOSIS — E063 Autoimmune thyroiditis: Secondary | ICD-10-CM | POA: Diagnosis not present

## 2023-02-15 DIAGNOSIS — Z79890 Hormone replacement therapy (postmenopausal): Secondary | ICD-10-CM | POA: Insufficient documentation

## 2023-02-15 DIAGNOSIS — E039 Hypothyroidism, unspecified: Secondary | ICD-10-CM | POA: Insufficient documentation

## 2023-02-15 DIAGNOSIS — N393 Stress incontinence (female) (male): Secondary | ICD-10-CM

## 2023-02-15 DIAGNOSIS — Z01818 Encounter for other preprocedural examination: Secondary | ICD-10-CM

## 2023-02-15 HISTORY — PX: RECTOCELE REPAIR: SHX761

## 2023-02-15 HISTORY — DX: Headache, unspecified: R51.9

## 2023-02-15 HISTORY — DX: Hypothyroidism, unspecified: E03.9

## 2023-02-15 HISTORY — PX: CYSTOSCOPY: SHX5120

## 2023-02-15 HISTORY — PX: XI ROBOTIC ASSISTED TOTAL HYSTERECTOMY WITH SACROCOLPOPEXY: SHX6825

## 2023-02-15 LAB — ABO/RH: ABO/RH(D): O POS

## 2023-02-15 LAB — TYPE AND SCREEN
ABO/RH(D): O POS
Antibody Screen: NEGATIVE

## 2023-02-15 LAB — POCT PREGNANCY, URINE: Preg Test, Ur: NEGATIVE

## 2023-02-15 SURGERY — HYSTERECTOMY, TOTAL, ROBOT-ASSISTED, WITH SACROCOLPOPEXY
Anesthesia: General | Site: Vagina

## 2023-02-15 MED ORDER — FENTANYL CITRATE (PF) 100 MCG/2ML IJ SOLN
INTRAMUSCULAR | Status: AC
  Filled 2023-02-15: qty 2

## 2023-02-15 MED ORDER — MIDAZOLAM HCL 2 MG/2ML IJ SOLN
INTRAMUSCULAR | Status: AC
  Filled 2023-02-15: qty 2

## 2023-02-15 MED ORDER — LIDOCAINE HCL (PF) 2 % IJ SOLN
INTRAMUSCULAR | Status: DC | PRN
  Administered 2023-02-15: 1.5 mg/kg/h via INTRADERMAL

## 2023-02-15 MED ORDER — LIDOCAINE 2% (20 MG/ML) 5 ML SYRINGE
INTRAMUSCULAR | Status: DC | PRN
  Administered 2023-02-15: 80 mg via INTRAVENOUS

## 2023-02-15 MED ORDER — PROPOFOL 10 MG/ML IV BOLUS
INTRAVENOUS | Status: AC
  Filled 2023-02-15: qty 20

## 2023-02-15 MED ORDER — ACETAMINOPHEN 160 MG/5ML PO SOLN: 325.0000 mg | ORAL | Status: DC | PRN

## 2023-02-15 MED ORDER — MIDAZOLAM HCL 5 MG/5ML IJ SOLN
INTRAMUSCULAR | Status: DC | PRN
  Administered 2023-02-15: 2 mg via INTRAVENOUS

## 2023-02-15 MED ORDER — OXYCODONE HCL 5 MG PO TABS
5.0000 mg | ORAL_TABLET | Freq: Once | ORAL | Status: AC | PRN
  Administered 2023-02-15: 5 mg via ORAL

## 2023-02-15 MED ORDER — IBUPROFEN 200 MG PO TABS
600.0000 mg | ORAL_TABLET | Freq: Four times a day (QID) | ORAL | Status: DC
  Administered 2023-02-15: 600 mg via ORAL

## 2023-02-15 MED ORDER — PHENAZOPYRIDINE HCL 100 MG PO TABS
ORAL_TABLET | ORAL | Status: AC
  Filled 2023-02-15: qty 2

## 2023-02-15 MED ORDER — CEFAZOLIN SODIUM-DEXTROSE 2-4 GM/100ML-% IV SOLN
2.0000 g | INTRAVENOUS | Status: AC
  Administered 2023-02-15: 2 g via INTRAVENOUS

## 2023-02-15 MED ORDER — ONDANSETRON HCL 4 MG/2ML IJ SOLN: 4.0000 mg | Freq: Once | INTRAMUSCULAR | Status: DC | PRN

## 2023-02-15 MED ORDER — CEFAZOLIN SODIUM-DEXTROSE 2-4 GM/100ML-% IV SOLN
INTRAVENOUS | Status: AC
  Filled 2023-02-15: qty 100

## 2023-02-15 MED ORDER — DEXAMETHASONE SODIUM PHOSPHATE 10 MG/ML IJ SOLN
INTRAMUSCULAR | Status: AC
  Filled 2023-02-15: qty 1

## 2023-02-15 MED ORDER — DEXMEDETOMIDINE HCL IN NACL 80 MCG/20ML IV SOLN
INTRAVENOUS | Status: AC
  Filled 2023-02-15: qty 20

## 2023-02-15 MED ORDER — OXYCODONE HCL 5 MG PO TABS
ORAL_TABLET | ORAL | Status: AC
  Filled 2023-02-15: qty 1

## 2023-02-15 MED ORDER — LACTATED RINGERS IV SOLN: INTRAVENOUS | Status: DC

## 2023-02-15 MED ORDER — ROCURONIUM BROMIDE 10 MG/ML (PF) SYRINGE
PREFILLED_SYRINGE | INTRAVENOUS | Status: DC | PRN
  Administered 2023-02-15: 20 mg via INTRAVENOUS
  Administered 2023-02-15: 60 mg via INTRAVENOUS
  Administered 2023-02-15 (×3): 20 mg via INTRAVENOUS

## 2023-02-15 MED ORDER — HEMOSTATIC AGENTS (NO CHARGE) OPTIME
TOPICAL | Status: DC | PRN
  Administered 2023-02-15: 1 via TOPICAL

## 2023-02-15 MED ORDER — ACETAMINOPHEN 325 MG PO TABS: 650.0000 mg | ORAL_TABLET | ORAL | Status: DC | PRN

## 2023-02-15 MED ORDER — ONDANSETRON HCL 4 MG/2ML IJ SOLN
INTRAMUSCULAR | Status: DC | PRN
  Administered 2023-02-15: 4 mg via INTRAVENOUS

## 2023-02-15 MED ORDER — PROPOFOL 10 MG/ML IV BOLUS
INTRAVENOUS | Status: DC | PRN
  Administered 2023-02-15: 150 mg via INTRAVENOUS

## 2023-02-15 MED ORDER — LIDOCAINE-EPINEPHRINE 1 %-1:100000 IJ SOLN
INTRAMUSCULAR | Status: DC | PRN
  Administered 2023-02-15: 8 mL

## 2023-02-15 MED ORDER — IBUPROFEN 200 MG PO TABS
ORAL_TABLET | ORAL | Status: AC
  Filled 2023-02-15: qty 3

## 2023-02-15 MED ORDER — FENTANYL CITRATE (PF) 100 MCG/2ML IJ SOLN
25.0000 ug | INTRAMUSCULAR | Status: DC | PRN
  Administered 2023-02-15: 50 ug via INTRAVENOUS

## 2023-02-15 MED ORDER — DEXMEDETOMIDINE HCL IN NACL 80 MCG/20ML IV SOLN
INTRAVENOUS | Status: DC | PRN
  Administered 2023-02-15: 16 ug via BUCCAL
  Administered 2023-02-15: 8 ug via BUCCAL

## 2023-02-15 MED ORDER — STERILE WATER FOR IRRIGATION IR SOLN
Status: DC | PRN
  Administered 2023-02-15: 500 mL

## 2023-02-15 MED ORDER — FENTANYL CITRATE (PF) 250 MCG/5ML IJ SOLN
INTRAMUSCULAR | Status: AC
  Filled 2023-02-15: qty 5

## 2023-02-15 MED ORDER — ROCURONIUM BROMIDE 10 MG/ML (PF) SYRINGE
PREFILLED_SYRINGE | INTRAVENOUS | Status: AC
  Filled 2023-02-15: qty 10

## 2023-02-15 MED ORDER — LIDOCAINE HCL (PF) 2 % IJ SOLN
INTRAMUSCULAR | Status: AC
  Filled 2023-02-15: qty 10

## 2023-02-15 MED ORDER — ONDANSETRON HCL 4 MG/2ML IJ SOLN
INTRAMUSCULAR | Status: AC
  Filled 2023-02-15: qty 2

## 2023-02-15 MED ORDER — PHENAZOPYRIDINE HCL 100 MG PO TABS
200.0000 mg | ORAL_TABLET | ORAL | Status: AC
  Administered 2023-02-15: 200 mg via ORAL

## 2023-02-15 MED ORDER — GABAPENTIN 300 MG PO CAPS
300.0000 mg | ORAL_CAPSULE | ORAL | Status: AC
  Administered 2023-02-15: 300 mg via ORAL

## 2023-02-15 MED ORDER — POVIDONE-IODINE 10 % EX SWAB: 2.0000 | Freq: Once | CUTANEOUS | Status: DC

## 2023-02-15 MED ORDER — SCOPOLAMINE 1 MG/3DAYS TD PT72
MEDICATED_PATCH | TRANSDERMAL | Status: DC | PRN
  Administered 2023-02-15: 1 via TRANSDERMAL

## 2023-02-15 MED ORDER — LIDOCAINE HCL (PF) 2 % IJ SOLN
INTRAMUSCULAR | Status: AC
  Filled 2023-02-15: qty 5

## 2023-02-15 MED ORDER — KETOROLAC TROMETHAMINE 30 MG/ML IJ SOLN: 30.0000 mg | Freq: Once | INTRAMUSCULAR | Status: DC | PRN

## 2023-02-15 MED ORDER — OXYCODONE HCL 5 MG/5ML PO SOLN: 5.0000 mg | Freq: Once | ORAL | Status: AC | PRN

## 2023-02-15 MED ORDER — SIMETHICONE 80 MG PO CHEW: 80.0000 mg | CHEWABLE_TABLET | Freq: Four times a day (QID) | ORAL | Status: DC | PRN

## 2023-02-15 MED ORDER — ACETAMINOPHEN 500 MG PO TABS
1000.0000 mg | ORAL_TABLET | ORAL | Status: AC
  Administered 2023-02-15: 1000 mg via ORAL

## 2023-02-15 MED ORDER — SUGAMMADEX SODIUM 200 MG/2ML IV SOLN
INTRAVENOUS | Status: DC | PRN
  Administered 2023-02-15: 170 mg via INTRAVENOUS

## 2023-02-15 MED ORDER — ACETAMINOPHEN 500 MG PO TABS
ORAL_TABLET | ORAL | Status: AC
  Filled 2023-02-15: qty 2

## 2023-02-15 MED ORDER — GABAPENTIN 300 MG PO CAPS
ORAL_CAPSULE | ORAL | Status: AC
  Filled 2023-02-15: qty 1

## 2023-02-15 MED ORDER — ONDANSETRON HCL 4 MG/2ML IJ SOLN: 4.0000 mg | Freq: Four times a day (QID) | INTRAMUSCULAR | Status: DC | PRN

## 2023-02-15 MED ORDER — ONDANSETRON HCL 4 MG PO TABS: 4.0000 mg | ORAL_TABLET | Freq: Four times a day (QID) | ORAL | Status: DC | PRN

## 2023-02-15 MED ORDER — DEXAMETHASONE SODIUM PHOSPHATE 10 MG/ML IJ SOLN
INTRAMUSCULAR | Status: DC | PRN
  Administered 2023-02-15: 10 mg via INTRAVENOUS

## 2023-02-15 MED ORDER — EPHEDRINE 5 MG/ML INJ
INTRAVENOUS | Status: AC
  Filled 2023-02-15: qty 5

## 2023-02-15 MED ORDER — FENTANYL CITRATE (PF) 100 MCG/2ML IJ SOLN
INTRAMUSCULAR | Status: DC | PRN
  Administered 2023-02-15: 150 ug via INTRAVENOUS
  Administered 2023-02-15: 50 ug via INTRAVENOUS

## 2023-02-15 MED ORDER — BUPIVACAINE HCL (PF) 0.25 % IJ SOLN
INTRAMUSCULAR | Status: DC | PRN
  Administered 2023-02-15: 25 mL

## 2023-02-15 MED ORDER — ACETAMINOPHEN 325 MG PO TABS: 325.0000 mg | ORAL_TABLET | ORAL | Status: DC | PRN

## 2023-02-15 MED ORDER — SODIUM CHLORIDE 0.9 % IR SOLN
Status: DC | PRN
  Administered 2023-02-15: 1000 mL

## 2023-02-15 MED ORDER — EPHEDRINE SULFATE (PRESSORS) 50 MG/ML IJ SOLN
INTRAMUSCULAR | Status: DC | PRN
  Administered 2023-02-15: 10 mg via INTRAVENOUS

## 2023-02-15 MED ORDER — KETOROLAC TROMETHAMINE 30 MG/ML IJ SOLN
INTRAMUSCULAR | Status: DC | PRN
  Administered 2023-02-15: 30 mg via INTRAVENOUS

## 2023-02-15 MED ORDER — OXYCODONE HCL 5 MG PO TABS
5.0000 mg | ORAL_TABLET | ORAL | Status: DC | PRN
  Administered 2023-02-15: 5 mg via ORAL

## 2023-02-15 SURGICAL SUPPLY — 106 items
ADH SKN CLS APL DERMABOND .7 (GAUZE/BANDAGES/DRESSINGS) ×4
AGENT HMST KT MTR STRL THRMB (HEMOSTASIS)
APL PRP STRL LF DISP 70% ISPRP (MISCELLANEOUS) ×4
APL SRG 38 LTWT LNG FL B (MISCELLANEOUS) ×4
APPLICATOR ARISTA FLEXITIP XL (MISCELLANEOUS) IMPLANT
BAG DRAIN URO-CYSTO SKYTR STRL (DRAIN) IMPLANT
BAG DRN RND TRDRP ANRFLXCHMBR (UROLOGICAL SUPPLIES) ×4
BAG DRN UROCATH (DRAIN)
BAG URINE DRAIN 2000ML AR STRL (UROLOGICAL SUPPLIES) IMPLANT
BLADE CLIPPER SENSICLIP SURGIC (BLADE) IMPLANT
BLADE SURG 15 STRL LF DISP TIS (BLADE) ×4 IMPLANT
BLADE SURG 15 STRL SS (BLADE) ×4
CATH FOLEY 2WAY SLVR  5CC 12FR (CATHETERS) ×4
CATH FOLEY 2WAY SLVR 5CC 12FR (CATHETERS) IMPLANT
CATH FOLEY 3WAY  5CC 16FR (CATHETERS) ×4
CATH FOLEY 3WAY 5CC 16FR (CATHETERS) ×4 IMPLANT
CATH ROBINSON RED A/P 12FR (CATHETERS) IMPLANT
CHLORAPREP W/TINT 26 (MISCELLANEOUS) ×4 IMPLANT
COVER BACK TABLE 60X90IN (DRAPES) ×4 IMPLANT
COVER TIP SHEARS 8 DVNC (MISCELLANEOUS) ×4 IMPLANT
COVER TIP SHEARS 8MM DA VINCI (MISCELLANEOUS) ×4
DEFOGGER SCOPE WARMER CLEARIFY (MISCELLANEOUS) ×4 IMPLANT
DERMABOND ADVANCED .7 DNX12 (GAUZE/BANDAGES/DRESSINGS) ×4 IMPLANT
DEVICE CAPIO SLIM SINGLE (INSTRUMENTS) IMPLANT
DRAPE ARM DVNC X/XI (DISPOSABLE) ×16 IMPLANT
DRAPE COLUMN DVNC XI (DISPOSABLE) ×4 IMPLANT
DRAPE DA VINCI XI ARM (DISPOSABLE) ×16
DRAPE DA VINCI XI COLUMN (DISPOSABLE) ×4
DRAPE HYSTEROSCOPY (MISCELLANEOUS) ×4 IMPLANT
DRAPE SHEET LG 3/4 BI-LAMINATE (DRAPES) ×4 IMPLANT
DRAPE SURG IRRIG POUCH 19X23 (DRAPES) ×4 IMPLANT
DRAPE UTILITY XL STRL (DRAPES) ×4 IMPLANT
ELECT REM PT RETURN 9FT ADLT (ELECTROSURGICAL) ×4
ELECTRODE REM PT RTRN 9FT ADLT (ELECTROSURGICAL) ×4 IMPLANT
GAUZE 4X4 16PLY ~~LOC~~+RFID DBL (SPONGE) IMPLANT
GLOVE BIOGEL PI IND STRL 6.5 (GLOVE) ×16 IMPLANT
GLOVE BIOGEL PI IND STRL 7.0 (GLOVE) ×8 IMPLANT
GLOVE ECLIPSE 6.0 STRL STRAW (GLOVE) ×12 IMPLANT
GOWN STRL REUS W/TWL LRG LVL3 (GOWN DISPOSABLE) ×4 IMPLANT
HEMOSTAT ARISTA ABSORB 3G PWDR (HEMOSTASIS) IMPLANT
HIBICLENS CHG 4% 4OZ BTL (MISCELLANEOUS) ×4 IMPLANT
HOLDER FOLEY CATH W/STRAP (MISCELLANEOUS) ×4 IMPLANT
IRRIG SUCT STRYKERFLOW 2 WTIP (MISCELLANEOUS) ×4
IRRIGATION SUCT STRKRFLW 2 WTP (MISCELLANEOUS) ×4 IMPLANT
IV NS 1000ML (IV SOLUTION) ×4
IV NS 1000ML BAXH (IV SOLUTION) IMPLANT
KIT PINK PAD W/HEAD ARE REST (MISCELLANEOUS) ×4
KIT PINK PAD W/HEAD ARM REST (MISCELLANEOUS) ×4 IMPLANT
KIT TURNOVER CYSTO (KITS) ×4 IMPLANT
LEGGING LITHOTOMY PAIR STRL (DRAPES) ×4 IMPLANT
MANIFOLD NEPTUNE II (INSTRUMENTS) ×4 IMPLANT
MANIPULATOR ADVINCU DEL 2.5 PL (MISCELLANEOUS) IMPLANT
MANIPULATOR ADVINCU DEL 3.0 PL (MISCELLANEOUS) IMPLANT
MANIPULATOR ADVINCU DEL 3.5 PL (MISCELLANEOUS) IMPLANT
MANIPULATOR ADVINCU DEL 4.0 PL (MISCELLANEOUS) IMPLANT
MESH VERTESSA LITE -Y 2X4X3 (Mesh General) IMPLANT
NDL HYPO 22X1.5 SAFETY MO (MISCELLANEOUS) ×4 IMPLANT
NDL INSUFFLATION 14GA 120MM (NEEDLE) ×4 IMPLANT
NDL MAYO 6 CRC TAPER PT (NEEDLE) IMPLANT
NEEDLE HYPO 22X1.5 SAFETY MO (MISCELLANEOUS) ×4 IMPLANT
NEEDLE INSUFFLATION 14GA 120MM (NEEDLE) ×4 IMPLANT
NEEDLE MAYO 6 CRC TAPER PT (NEEDLE) ×4 IMPLANT
NS IRRIG 1000ML POUR BTL (IV SOLUTION) ×4 IMPLANT
OBTURATOR OPTICAL STANDARD 8MM (TROCAR) ×4
OBTURATOR OPTICAL STND 8 DVNC (TROCAR) ×4
OBTURATOR OPTICALSTD 8 DVNC (TROCAR) ×4 IMPLANT
PACK CYSTO (CUSTOM PROCEDURE TRAY) ×4 IMPLANT
PACK ROBOT WH (CUSTOM PROCEDURE TRAY) ×4 IMPLANT
PACK ROBOTIC GOWN (GOWN DISPOSABLE) ×4 IMPLANT
PACK VAGINAL WOMENS (CUSTOM PROCEDURE TRAY) ×4 IMPLANT
PAD OB MATERNITY 4.3X12.25 (PERSONAL CARE ITEMS) ×4 IMPLANT
PAD PREP 24X48 CUFFED NSTRL (MISCELLANEOUS) ×4 IMPLANT
POUCH LAPAROSCOPIC INSTRUMENT (MISCELLANEOUS) IMPLANT
PROTECTOR NERVE ULNAR (MISCELLANEOUS) ×4 IMPLANT
RETRACTOR LONE STAR DISPOSABLE (INSTRUMENTS) ×4 IMPLANT
RETRACTOR STAY HOOK 5MM (MISCELLANEOUS) ×4 IMPLANT
SEAL UNIV 5-12 XI (MISCELLANEOUS) ×20 IMPLANT
SEAL XI UNIVERSAL 5-12 (MISCELLANEOUS) ×20
SEALER VESSEL DA VINCI XI (MISCELLANEOUS) ×4
SEALER VESSEL EXT DVNC XI (MISCELLANEOUS) IMPLANT
SET IRRIG Y TYPE TUR BLADDER L (SET/KITS/TRAYS/PACK) ×4 IMPLANT
SET TUBE SMOKE EVAC HIGH FLOW (TUBING) ×4 IMPLANT
SLEEVE SCD COMPRESS KNEE MED (STOCKING) ×4 IMPLANT
SPIKE FLUID TRANSFER (MISCELLANEOUS) ×8 IMPLANT
SURGIFLO W/THROMBIN 8M KIT (HEMOSTASIS) IMPLANT
SUT ABS MONO DBL WITH NDL 48IN (SUTURE) IMPLANT
SUT GORETEX NAB #0 THX26 36IN (SUTURE) ×4 IMPLANT
SUT MNCRL AB 4-0 PS2 18 (SUTURE) ×4 IMPLANT
SUT MON AB 2-0 SH 27 (SUTURE) ×4 IMPLANT
SUT V-LOC BARB 180 2/0GR9 GS23 (SUTURE) ×8
SUT VIC AB 0 CT1 27 (SUTURE) ×12
SUT VIC AB 0 CT1 27XBRD ANBCTR (SUTURE) IMPLANT
SUT VIC AB 0 CT1 27XBRD ANTBC (SUTURE) ×8 IMPLANT
SUT VIC AB 2-0 SH 27 (SUTURE)
SUT VIC AB 2-0 SH 27XBRD (SUTURE) IMPLANT
SUT VICRYL 2-0 SH 8X27 (SUTURE) ×4 IMPLANT
SUT VLOC 180 0 9IN  GS21 (SUTURE) ×4
SUT VLOC 180 0 9IN GS21 (SUTURE) IMPLANT
SUT VLOC 180 2-0 9IN GS21 (SUTURE) IMPLANT
SUTURE V-LC BRB 180 2/0GR9GS23 (SUTURE) ×8 IMPLANT
SYR BULB EAR ULCER 3OZ GRN STR (SYRINGE) ×4 IMPLANT
SYSTEM URETHRAL BULK BULKAMID (Female Continence) IMPLANT
TOWEL OR 17X24 6PK STRL BLUE (TOWEL DISPOSABLE) ×4 IMPLANT
TRAY FOLEY W/BAG SLVR 14FR LF (SET/KITS/TRAYS/PACK) ×4 IMPLANT
TUBE CONNECTING 12X1/4 (SUCTIONS) IMPLANT
WATER STERILE IRR 500ML POUR (IV SOLUTION) IMPLANT

## 2023-02-15 NOTE — Op Note (Signed)
Operative Note  Preoperative Diagnosis: anterior vaginal prolapse, posterior vaginal prolapse, uterovaginal prolapse, incomplete, and stress urinary incontinence  Postoperative Diagnosis: same  Procedures performed:  Robotic assisted total laparoscopic hysterectomy, bilateral salpingectomy, sacrocolpopexy (vertessa lite Y mesh), perineorrhaphy, cystoscopy, urethral bulking (Bulkamid)  Implants:  Implant Name Type Inv. Item Serial No. Manufacturer Lot No. LRB No. Used Action  MESH Valli Glance [id number] Mesh [employer] [place] [id number] N/A 1 Implanted  SYSTEM Hinda Kehr [id number] Female Continence SYSTEM Merla Riches [place] [id number] N/A 1 Implanted    Attending Surgeon: Sherlene Shams, MD   Anesthesia: General endotracheal  Findings: 1. On vaginal exam, stage II prolapse noted  2. On laparoscopy, normal appearing uterus, fallopian tubes and ovaries. Mild adhesions of large bowel to left pelvic sidewall which were not removed.   3. On cystoscopy, normal bladder and urethra without injury, lesion or foreign body. Brisk bilateral ureteral efflux noted.    Specimens:  ID Type Source Tests Collected by Time Destination  1 : uterus, cervix, and bilateral fallopian tubes Tissue PATH Gyn benign resection SURGICAL PATHOLOGY Jaquita Folds, MD 02/15/2023 [phone]     Estimated blood loss: 30 mL  IV fluids: 1000 mL  Urine output: [id number] mL  Complications: none  Procedure in Detail: After informed consent was obtained, the patient was taken to the operating room, where general anesthesia was induced and found to be adequate. She was placed in dorsolithotomy position in yellowfin stirrups. Her hips were noted not to be hyperflexed or hyperextended. Her arms were padded with gel pads and tucked to her sides. Her hands were surrounded by foam. A padded strap was placed across her chest with foam between the pad and  her skin. She was noted to be appropriately positioned with all pressure points well padded and off tension. A tilt test showed no slippage. She was prepped and draped in the usual sterile fashion. A uterine manipulator was placed in the uterus after sounding to 7 cm, an appropriately sized Koh ring was placed around the cervix, and a pneumo-occluder balloon was positioned in the vagina for later use.  A sterile Foley catheter was inserted.   0.25% plain Marcaine was injected in the umbilical  area and an 8 mm supraumbilical skin incision was made with the scalpel.  A Veress needle was inserted into the incision, CO2 insufflation was started, a low opening pressure was noted, and pneumoperitoneum was obtained. The Veress needle was removed and a 56mm robotic trocar was placed. The robotic camera was inserted and intraperitoneal placement was confirmed. Survey of the abdomen and pelvis revealed the findings as noted above. The sacrum appeared to be free of any adhesive disease. After determining placement for the other ports, Local anesthetic was injected at each site and two 8 mm incisions were made for robotic ports at 10 cm lateral to and at the level of the umbilical port. Two additional 8 mm incisions were made 10 cm lateral to these and 30 degrees down followed by 8 mm robotic ports - the right side for an assistant port. All trocars were placed sequentially under direct visualization of the camera. The patient was placed in Trendelenburg. The robot was docked on the patient's left side. Monopolar endoshears alternating with the vessel sealer were placed in the right arm, a [place] bipolar grasper was placed in the 2nd arm of the patient's left side, and a Tip up grasper was placed  in the 3rd arm on the patient's left side.   Attention was then turned to the robotic hysterectomy and salpingectomy. The ureter was identified and was found to be well away from the planned site of incision.  The right tube was  grasped and the mesosalpinx was cauterized and cut. The uteroovarian ligament was then cauterized and cut. The right round ligament was grasped, cauterized, and transected with electrocautery.  The anterior and posterior leaves of the broad ligament were taken down with cautery and sharp dissection. The uterine artery was skeletonized and the bladder flap was created on the right side with a combination of electrosurgery and sharp dissection. The KOH ring was identified. The right uterine artery was clamped, cauterized, and transected. In a similar fashion, the left side was taken down. Further sharp dissection with combination of cautery was performed to further develop the bladder flap. At this point, the KOH ring was completely hugging the cervix. The pneumo-occluder balloon in the vagina was inflated to maintain pneumoperitoneum. A colpotomy was performed with electrosurgical cutting current and the uterus and cervix were completely amputated from the vagina. The specimen was delivered through the vagina. The posterior portion of the vaginal cuff was then grasped and pulled up to maintain pneumoperitoneum. The pneumo-occluder balloon was then replaced in the vagina. The right hand instrument was changed to a suture-cut needle driver. The vaginal cuff was then closed using a 0 V-loc suture.    The right hand instrument was replaced with monopolar endoshears. With a lucite probe in the vagina, the anterior vaginal dissection was then performed with sharp dissection and electrosurgery. The posterior vaginal dissection was then performed with sharp dissection and electrosurgery in order to dissect the rectum away from the posterior vagina. Attention was then turned to the sacral promontory. The peritoneum overlying the sacral promontory was tented up, dissected sharply with monopolar scissors and electrosurgery using layer by layer technique. The peritoneal incision was extended down to the posterior cul-de-sac.  This was performed with care to avoid the ureter on the right side and the sigmoid colon and its mesentary on the left side.  A "Y" mesh was then inserted into the abdomen after trimming to appropriate size. With the probe in the vagina, the anterior leaf of the Y mesh was affixed to the anterior portion of the vagina using a 2-0 v-loc suture in a spiral pattern to distribute the suture evenly across the surface of the anterior mesh leaf. In a similar fashion, the posterior leaf of the Y mesh was attached to the posterior surface of the vagina with 2-0 v-loc suture.  The distal end of the mesh was then brought to overlie the sacrum. The correct amount of tension was determined in order to elevate the vagina, but not put the mesh under tension. The distal end of the mesh was then affixed to the anterior longitudinal sacral ligament using two interrupted transverse stitches of CV2 Gortex. The excess distal mesh was then cut and removed. The peritoneum was reapproximated over the mesh using 2-0 monocryl. The bladder flap was incorporated to completely retroperitonealize the mesh. All pedicles were carefully inspected and noted to be hemostatic as the CO2 gas was deflated. All instruments were removed from the patient's abdomen.   The Foley catheter was removed.  A 70-degree cystoscope was introduced, and 360-degree inspection revealed no injury, lesion or foreign body in the bladder. Brisk bilateral ureteral efflux was noted with the assistance of pyridium.  The bladder was drained and  the cystoscope was removed.  The Foley catheter was replaced.  The robot was undocked. The CO2 gas was removed and the ports were removed.  The skin incisions were closed with subcutaneous stitches of 4-0 Monocryl and covered with skin glue.   Attention was then turned to the perineum. Two allis clamps were placed at the introitus. The perineum was injected with 1% lidocaine with epinephrine. A diamond shaped incision was made over  the perineum and excess skin was removed. Dissection was performed with Metzenbaum scissors to separate the mucosa from the underlying tissue. The perineal body was then reapproximated with three interrupted 0-vicryl sutures. The perineal skin was then closed with a 2-0 vicryl in a subcutaneous and subcuticular fashion. Irrigation was performed and good hemostasis was noted.     A 0 degree bulkamid cystoscope was inserted into the urethra into the bladder.   The needle was primed.  The cystoscope was inserted to the level of the bladder neck.  The needle was inserted 2 cm and the scope was pulled back into the urethra 2 cm.  The needle was inserted bevel up at the 5 o'clock position and the Bulkamid was injected to obtain coaptation.  This was repeated at the 2 o'clock,  10 o'clock and 7 o'clock positions.   A total of 2 11ml syringes were used. and good circumferential coaptation was noted.  The cystoscope was removed and a 21F foley catheter was replaced.    Sponge, lap, and needle counts were correct x 2. The patient tolerated the procedure well. She was awakened from anesthesia and transferred to the recovery room in stable condition.    Jaquita Folds, MD

## 2023-02-15 NOTE — Interval H&P Note (Signed)
History and Physical Interval Note:  02/15/2023 7:21 AM  Patricia Mendez  has presented today for surgery, with the diagnosis of anterior vaginal prolpase; posterior vaginal prolapse; uterovaginal prolapse incomplete; stress urinary incontinence.  The various methods of treatment have been discussed with the patient and family. After consideration of risks, benefits and other options for treatment, the patient has consented to  Procedure(s): XI ROBOTIC ASSISTED TOTAL HYSTERECTOMY WITH BILATERAL SALPINGECTOMY AND SACROCOLPOPEXY (N/A) URETHRAL BULKING (N/A) POSTERIOR REPAIR (RECTOCELE) AND PERINEORRHAPY-----POSSIBLE (N/A) CYSTOSCOPY (N/A) as a surgical intervention.  The patient's history has been reviewed, patient examined, no change in status, stable for surgery.  I have reviewed the patient's chart and labs.  Questions were answered to the patient's satisfaction.     Jaquita Folds

## 2023-02-15 NOTE — Anesthesia Procedure Notes (Addendum)
Procedure Name: Intubation Date/Time: 02/15/2023 7:48 AM  Performed by: Bonney Aid, CRNAPre-anesthesia Checklist: Patient identified, Emergency Drugs available, Suction available and Patient being monitored Patient Re-evaluated:Patient Re-evaluated prior to induction Oxygen Delivery Method: Circle system utilized Preoxygenation: Pre-oxygenation with 100% oxygen Induction Type: IV induction Ventilation: Mask ventilation without difficulty Laryngoscope Size: Mac and 3 Tube type: Oral Tube size: 7.0 mm Number of attempts: 1 Airway Equipment and Method: Stylet Placement Confirmation: ETT inserted through vocal cords under direct vision, positive ETCO2 and breath sounds checked- equal and bilateral Secured at: 20 cm Tube secured with: Tape Dental Injury: Teeth and Oropharynx as per pre-operative assessment

## 2023-02-15 NOTE — Telephone Encounter (Signed)
Patricia Mendez underwent Robotic assisted total laparoscopic hysterectomy, bilateral salpingectomy, sacrocolpopexy (vertessa lite Y mesh), perineorrhaphy, cystoscopy, urethral bulking (Bulkamid) on 02/15/23.   She failed her voiding trial.  361ml was backfilled into the bladder She was unable to void  She was discharged with a 69F catheter. She will remove the catheter herself in the morning on POD#1 and call the office if unable to void. Please call her for a routine post op check. Thanks!  Patricia Folds, MD

## 2023-02-15 NOTE — Progress Notes (Signed)
Patient has very sensitive skin, areas observed under eyes and on her left flank, slight redness in small patches.

## 2023-02-15 NOTE — Anesthesia Preprocedure Evaluation (Signed)
Anesthesia Evaluation  Patient identified by MRN, date of birth, ID band Patient awake    Reviewed: Allergy & Precautions, H&P , NPO status , Patient's Chart, lab work & pertinent test results  Airway Mallampati: I  TM Distance: >3 FB Neck ROM: full    Dental no notable dental hx. (+) Teeth Intact   Pulmonary neg pulmonary ROS   breath sounds clear to auscultation       Cardiovascular negative cardio ROS Normal cardiovascular exam     Neuro/Psych  negative psych ROS   GI/Hepatic negative GI ROS, Neg liver ROS,,,  Endo/Other  Hypothyroidism    Renal/GU negative Renal ROS     Musculoskeletal   Abdominal Normal abdominal exam  (+)   Peds  Hematology negative hematology ROS (+)   Anesthesia Other Findings   Reproductive/Obstetrics negative OB ROS                             Anesthesia Physical Anesthesia Plan  ASA: 2  Anesthesia Plan: General   Post-op Pain Management:    Induction: Intravenous  PONV Risk Score and Plan: 4 or greater and Ondansetron, Dexamethasone and Midazolam  Airway Management Planned: Oral ETT  Additional Equipment: None  Intra-op Plan:   Post-operative Plan: Extubation in OR  Informed Consent: I have reviewed the patients History and Physical, chart, labs and discussed the procedure including the risks, benefits and alternatives for the proposed anesthesia with the patient or authorized representative who has indicated his/her understanding and acceptance.     Dental advisory given  Plan Discussed with: CRNA  Anesthesia Plan Comments:        Anesthesia Quick Evaluation

## 2023-02-15 NOTE — Transfer of Care (Signed)
Immediate Anesthesia Transfer of Care Note  Patient: Patricia Mendez  Procedure(s) Performed: XI ROBOTIC ASSISTED TOTAL HYSTERECTOMY WITH BILATERAL SALPINGECTOMY AND SACROCOLPOPEXY (Pelvis) URETHRAL BULKING (Urethra) POSTERIOR REPAIR (RECTOCELE) AND PERINEORRHAPY (Vagina ) CYSTOSCOPY (Bladder)  Patient Location: PACU  Anesthesia Type:General  Level of Consciousness: sedated  Airway & Oxygen Therapy: Patient Spontanous Breathing and Patient connected to nasal cannula oxygen  Post-op Assessment: Report given to RN  Post vital signs: Reviewed and stable  Last Vitals:  Vitals Value Taken Time  BP 107/72 02/15/23 1045  Temp    Pulse 83 02/15/23 1049  Resp 15 02/15/23 1049  SpO2 98 % 02/15/23 1049  Vitals shown include unvalidated device data.  Last Pain:  Vitals:   02/15/23 0603  TempSrc: Oral  PainSc: 0-No pain      Patients Stated Pain Goal: 5 ([id number])  Complications: No notable events documented.

## 2023-02-15 NOTE — Discharge Instructions (Addendum)

## 2023-02-15 NOTE — Anesthesia Postprocedure Evaluation (Signed)
Anesthesia Post Note  Patient: Patricia Mendez  Procedure(s) Performed: XI ROBOTIC ASSISTED TOTAL HYSTERECTOMY WITH BILATERAL SALPINGECTOMY AND SACROCOLPOPEXY (Pelvis) URETHRAL BULKING (Urethra) POSTERIOR REPAIR (RECTOCELE) AND PERINEORRHAPY (Vagina ) CYSTOSCOPY (Bladder)     Patient location during evaluation: PACU Anesthesia Type: General Level of consciousness: sedated Pain management: pain level controlled Vital Signs Assessment: post-procedure vital signs reviewed and stable Respiratory status: spontaneous breathing Cardiovascular status: stable Postop Assessment: no apparent nausea or vomiting Anesthetic complications: no  No notable events documented.  Last Vitals:  Vitals:   02/15/23 1145 02/15/23 1151  BP: 97/67   Pulse: 65 76  Resp: 13 13  Temp:    SpO2: 95% 95%    Last Pain:  Vitals:   02/15/23 0603  TempSrc: Oral  PainSc: 0-No pain                 Huston Foley

## 2023-02-16 ENCOUNTER — Encounter (HOSPITAL_BASED_OUTPATIENT_CLINIC_OR_DEPARTMENT_OTHER): Payer: Self-pay | Admitting: Obstetrics and Gynecology

## 2023-02-16 LAB — SURGICAL PATHOLOGY

## 2023-02-17 ENCOUNTER — Encounter: Payer: Self-pay | Admitting: Obstetrics and Gynecology

## 2023-02-17 ENCOUNTER — Ambulatory Visit (INDEPENDENT_AMBULATORY_CARE_PROVIDER_SITE_OTHER): Payer: 59

## 2023-02-17 DIAGNOSIS — R339 Retention of urine, unspecified: Secondary | ICD-10-CM

## 2023-02-17 NOTE — Patient Instructions (Signed)
Keep voiding trial appt for 02/22/2023

## 2023-02-17 NOTE — Progress Notes (Signed)
Patricia Mendez is a 42 y.o. female here for a cath placement .Pt complains of not completely emptying her bladder. Pt was scanned for 970mL 51fr cath placed with no complications.  PVR: 981 Cathed for : 1049mL

## 2023-02-18 NOTE — Telephone Encounter (Signed)
Called patient as she had to have a catheter replaced yesterday Pain reported to be 3/10. Is taking ibuprofen Patient has had x2 bowel movements since surgery.  Reports she is seeing some spotting but no significant amount of bleeding.  Overall is doing better today than yesterday. Will return Monday for catheter removal in office.

## 2023-02-22 ENCOUNTER — Ambulatory Visit (INDEPENDENT_AMBULATORY_CARE_PROVIDER_SITE_OTHER): Payer: 59

## 2023-02-22 DIAGNOSIS — N898 Other specified noninflammatory disorders of vagina: Secondary | ICD-10-CM

## 2023-02-22 DIAGNOSIS — Z9889 Other specified postprocedural states: Secondary | ICD-10-CM

## 2023-02-22 MED ORDER — ESTRADIOL 0.1 MG/GM VA CREA: 0.5000 g | TOPICAL_CREAM | VAGINAL | 11 refills | Status: DC

## 2023-02-22 NOTE — Progress Notes (Signed)
Urogyn Nurse Voiding Trial Note  Patricia Mendez underwent XI ROBOTIC ASSISTED TOTAL HYSTERECTOMY WITH BILATERAL SALPINGECTOMY AND SACROCOLPOPEXY, URETHRAL BULKING, POSTERIOR REPAIR (RECTOCELE) AND PERINEORRHAPY and on CYSTOSCOPY .  She presents today for a voiding trial .   Patient was identified with 2 indicators. 239ml of NS was instilled into the bladder via the catheter. The catheter was removed and patient was instructed to void into the urinary hat. She voided 246ml. The post void residual measured by bladder scan was 19ml. She passed the voiding trial and a catheter was not replaced.   The patient received aftercare instructions and will follow up as scheduled.

## 2023-02-22 NOTE — Progress Notes (Signed)
Vaginal estrogen cream was ordered for vaginal dryness. She should use a fingertip amount at the opening of the vagina every night for two weeks then reduce to twice a week after that. Can continue to use aquaphor during the day as well.   Patricia Folds, MD

## 2023-02-22 NOTE — Addendum Note (Signed)
Addended by: Jaquita Folds on: 02/22/2023 11:06 AM   Modules accepted: Orders

## 2023-02-22 NOTE — Patient Instructions (Signed)
Please keep you next Post op visit.

## 2023-02-22 NOTE — Addendum Note (Signed)
Addendum  created 02/22/23 0956 by Suan Halter, CRNA   Intraprocedure Staff edited

## 2023-02-23 NOTE — Progress Notes (Signed)
LM on the V< for the pt to call back

## 2023-03-02 NOTE — Progress Notes (Signed)
Pt was notified.  

## 2023-03-18 ENCOUNTER — Encounter: Payer: Self-pay | Admitting: Obstetrics and Gynecology

## 2023-03-23 ENCOUNTER — Ambulatory Visit (INDEPENDENT_AMBULATORY_CARE_PROVIDER_SITE_OTHER): Payer: 59 | Admitting: Obstetrics and Gynecology

## 2023-03-23 ENCOUNTER — Encounter: Payer: Self-pay | Admitting: Obstetrics and Gynecology

## 2023-03-23 VITALS — BP 120/78 | HR 85

## 2023-03-23 DIAGNOSIS — Z9889 Other specified postprocedural states: Secondary | ICD-10-CM

## 2023-03-23 NOTE — Progress Notes (Signed)
[facility]  Date of Visit: 03/23/2023  History of Present Illness: Ms. Hollick is a 42 y.o. female scheduled today for a post-operative visit.   Surgery: s/p Robotic assisted total laparoscopic hysterectomy, bilateral salpingectomy, sacrocolpopexy (vertessa lite Y mesh), perineorrhaphy, cystoscopy, urethral bulking (Bulkamid) on 02/15/23  She did not pass her postoperative void trial- removed catheter and needed replaced until 4/1  Postoperative course has otherwise been uncomplicated.   Today she reports overall she is feeling well. She had some bothersome irritation around the introitus but it feels better this week.   UTI in the last 6 weeks? No  Pain? No  She has returned to her normal activity (except for postop restrictions) Vaginal bulge? No  Stress incontinence: No  Urgency/frequency: No  Urge incontinence: No  Voiding dysfunction: No  Bowel issues: No , she did the miralax for two weeks  Subjective Success: Do you usually have a bulge or something falling out that you can see or feel in the vaginal area? No  Retreatment Success: Any retreatment with surgery or pessary for any compartment? No   Pathology results: UTERUS, CERVIX, BILATERAL FALLOPIAN TUBES, HYSTERECTOMY AND  SALPINGECTOMY:  - Uterus with leiomyomata, largest measuring 1 cm  - Benign inactive endometrium  - Benign unremarkable cervix  - Benign unremarkable bilateral fallopian tubes  - No evidence of malignancy   Medications: She has a current medication list which includes the following prescription(s): acetaminophen, bupropion, estradiol, levothyroxine, phentermine, and polyethylene glycol powder.   Allergies: Patient has No Known Allergies.   Physical Exam: BP 120/78   Pulse 85   Abdomen: soft, non-tender, without masses or organomegaly Laparoscopic Incisions: healing well.  Pelvic Examination: Vagina: Incisions healing well. Sutures are present at the cuff and there is not granulation  tissue. No tenderness along the anterior or posterior vagina. No apical tenderness. No pelvic masses. No visible or palpable mesh.  POP-Q: POP-Q  -3                                            Aa   -3                                           Ba  -8                                              C   3.5                                            Gh  4                                            Pb  8  tvl   -2.5                                            Ap  -2.5                                            Bp                                                 D     ---------------------------------------------------------  Assessment and Plan:  1. Post-operative state     - Pathology results were reviewed with the patient today and she verbalized understanding that the results were benign.  - Can resume regular activity including exercise. Wait an additional 6 weeks for intercourse.  - Discussed avoidance of heavy lifting and straining long term to reduce the risk of recurrence.  - No current SUI but if she notices some leakage as she is becoming more active, then can consider repeat urethral bulking in the office.  - Recommend yearly follow up with general GYN. No longer needs pap smears.   Return as needed  All questions answered.   Marguerita Beards, MD

## 2023-04-02 ENCOUNTER — Other Ambulatory Visit (HOSPITAL_COMMUNITY): Payer: Self-pay

## 2023-04-27 ENCOUNTER — Other Ambulatory Visit: Payer: Self-pay | Admitting: Family Medicine

## 2023-04-27 DIAGNOSIS — E039 Hypothyroidism, unspecified: Secondary | ICD-10-CM

## 2023-04-27 MED ORDER — BUPROPION HCL ER (XL) 300 MG PO TB24: 300.0000 mg | ORAL_TABLET | Freq: Every day | ORAL | 3 refills | Status: DC

## 2023-04-27 MED ORDER — LEVOTHYROXINE SODIUM 150 MCG PO TABS: 150.0000 ug | ORAL_TABLET | Freq: Every day | ORAL | 3 refills | Status: DC

## 2023-05-25 ENCOUNTER — Ambulatory Visit: Payer: Self-pay | Admitting: Family Medicine

## 2023-06-15 ENCOUNTER — Ambulatory Visit: Payer: Self-pay | Admitting: Family Medicine

## 2023-06-15 ENCOUNTER — Other Ambulatory Visit (HOSPITAL_COMMUNITY): Payer: Self-pay

## 2023-06-23 ENCOUNTER — Other Ambulatory Visit (HOSPITAL_COMMUNITY): Payer: Self-pay

## 2023-06-23 ENCOUNTER — Encounter: Payer: Self-pay | Admitting: Family Medicine

## 2023-06-23 ENCOUNTER — Ambulatory Visit: Payer: Self-pay | Admitting: Family Medicine

## 2023-06-23 VITALS — BP 105/70 | HR 74 | Temp 98.6°F | Ht 65.0 in | Wt 177.0 lb

## 2023-06-23 DIAGNOSIS — Z6829 Body mass index (BMI) 29.0-29.9, adult: Secondary | ICD-10-CM | POA: Diagnosis not present

## 2023-06-23 DIAGNOSIS — E039 Hypothyroidism, unspecified: Secondary | ICD-10-CM | POA: Diagnosis not present

## 2023-06-23 DIAGNOSIS — E663 Overweight: Secondary | ICD-10-CM

## 2023-06-23 MED ORDER — PHENTERMINE HCL 37.5 MG PO CAPS
37.5000 mg | ORAL_CAPSULE | ORAL | 1 refills | Status: DC
  Filled 2023-06-23: qty 90, 90d supply, fill #0

## 2023-06-23 MED ORDER — PHENTERMINE HCL 37.5 MG PO CAPS: 37.5000 mg | ORAL_CAPSULE | ORAL | 1 refills | Status: DC

## 2023-06-23 NOTE — Assessment & Plan Note (Signed)
Chronic, previously stable Pt notes some concern with possible changes with manufacturer since changing insurance Wishes to have labs checked

## 2023-06-23 NOTE — Assessment & Plan Note (Signed)
Reports 4# weight gain since surgery and travel Wishes to restart phentermine to assist Continue to recommend balanced, lower carb meals. Smaller meal size, adding snacks. Choosing water as drink of choice and increasing purposeful exercise.

## 2023-06-23 NOTE — Progress Notes (Signed)
Established patient visit   Patient: Patricia Mendez   DOB: Aug 24, 1981   42 y.o. Female  MRN: [id number] Visit Date: 06/23/2023  Today's healthcare provider: Jacky Kindle, FNP  Introduced to nurse practitioner role and practice setting.  All questions answered.  Discussed provider/patient relationship and expectations.  Subjective    HPI HPI   Patient has run out of refills on her Phentermine and would like refills today.  She reports good compliance until it ran out and good tolerance.  Patient has been out for a month and during that time she has had vacation.  Her weight has gone up 4 pounds so she really wants to get back on track. Last edited by Adline Peals, CMA on 06/23/2023  8:15 AM.      Medications: Outpatient Medications Prior to Visit  Medication Sig Note   acetaminophen (TYLENOL) 500 MG tablet Take 1 tablet (500 mg total) by mouth every 6 (six) hours as needed (pain).    buPROPion (WELLBUTRIN XL) 300 MG 24 hr tablet Take 1 tablet (300 mg total) by mouth daily.    estradiol (ESTRACE) 0.1 MG/GM vaginal cream Place 0.5 g vaginally 2 (two) times a week. Place 0.5g nightly for two weeks then twice a week after    levothyroxine (SYNTHROID) 150 MCG tablet Take 1 tablet (150 mcg total) by mouth daily.    polyethylene glycol powder (GLYCOLAX/MIRALAX) 17 GM/SCOOP powder Take 17 g by mouth daily. Drink 17g (1 scoop) dissolved in water per day.    [DISCONTINUED] phentermine 37.5 MG capsule Take 1 capsule (37.5 mg total) by mouth every morning. Hold for post-op clearance from GYN 06/23/2023: See office note   No facility-administered medications prior to visit.       Objective    BP 105/70 (BP Location: Left Arm, Patient Position: Sitting, Cuff Size: Normal)   Pulse 74   Temp 98.6 F (37 C) (Oral)   Ht 5\' 5"  (1.651 m)   Wt 177 lb (80.3 kg)   SpO2 100%   BMI 29.45 kg/m   Physical Exam Vitals and nursing note reviewed.  Constitutional:      General: She is not in  acute distress.    Appearance: Normal appearance. She is overweight. She is not ill-appearing, toxic-appearing or diaphoretic.  HENT:     Head: Normocephalic and atraumatic.  Cardiovascular:     Rate and Rhythm: Normal rate and regular rhythm.     Pulses: Normal pulses.     Heart sounds: Normal heart sounds. No murmur heard.    No friction rub. No gallop.  Pulmonary:     Effort: Pulmonary effort is normal. No respiratory distress.     Breath sounds: Normal breath sounds. No stridor. No wheezing, rhonchi or rales.  Chest:     Chest wall: No tenderness.  Musculoskeletal:        General: No swelling, tenderness, deformity or signs of injury. Normal range of motion.     Right lower leg: No edema.     Left lower leg: No edema.  Skin:    General: Skin is warm and dry.     Capillary Refill: Capillary refill takes less than 2 seconds.     Coloration: Skin is not jaundiced or pale.     Findings: No bruising, erythema, lesion or rash.  Neurological:     General: No focal deficit present.     Mental Status: She is alert and oriented to person, place, and time. Mental status  is at baseline.     Cranial Nerves: No cranial nerve deficit.     Sensory: No sensory deficit.     Motor: No weakness.     Coordination: Coordination normal.  Psychiatric:        Mood and Affect: Mood normal.        Behavior: Behavior normal.        Thought Content: Thought content normal.        Judgment: Judgment normal.     No results found for any visits on 06/23/23.  Assessment & Plan     Problem List Items Addressed This Visit       Endocrine   Acquired hypothyroidism    Chronic, previously stable Pt notes some concern with possible changes with manufacturer since changing insurance Wishes to have labs checked       Relevant Orders   TSH     Other   Overweight with body mass index (BMI) of 29 to 29.9 in adult - Primary    Reports 4# weight gain since surgery and travel Wishes to restart  phentermine to assist Continue to recommend balanced, lower carb meals. Smaller meal size, adding snacks. Choosing water as drink of choice and increasing purposeful exercise.       Relevant Medications   phentermine 37.5 MG capsule   Other Relevant Orders   CBC with Differential/Platelet   Comprehensive Metabolic Panel (CMET)   TSH   Hemoglobin A1c   Lipid panel   Return in about 6 months (around 12/24/2023) for annual examination.     Leilani Merl, FNP, have reviewed all documentation for this visit. The documentation on 06/23/23 for the exam, diagnosis, procedures, and orders are all accurate and complete.  Jacky Kindle, FNP  [facility] [phone] (phone) [phone] (fax)  [facility]

## 2023-06-24 ENCOUNTER — Other Ambulatory Visit: Payer: Self-pay | Admitting: Family Medicine

## 2023-06-24 ENCOUNTER — Encounter: Payer: Self-pay | Admitting: Family Medicine

## 2023-06-24 MED ORDER — LEVOTHYROXINE SODIUM 175 MCG PO TABS: 175.0000 ug | ORAL_TABLET | Freq: Every day | ORAL | 3 refills | Status: DC

## 2023-07-01 ENCOUNTER — Telehealth: Payer: Self-pay

## 2023-07-01 NOTE — Telephone Encounter (Signed)
Patient was called to go over lab results.

## 2024-04-11 ENCOUNTER — Encounter: Payer: Self-pay | Admitting: Family Medicine

## 2024-04-11 ENCOUNTER — Ambulatory Visit (INDEPENDENT_AMBULATORY_CARE_PROVIDER_SITE_OTHER): Admitting: Family Medicine

## 2024-04-11 VITALS — BP 134/73 | HR 65 | Temp 98.0°F | Resp 20 | Ht 65.0 in | Wt 183.0 lb

## 2024-04-11 DIAGNOSIS — E66811 Obesity, class 1: Secondary | ICD-10-CM

## 2024-04-11 DIAGNOSIS — E039 Hypothyroidism, unspecified: Secondary | ICD-10-CM | POA: Diagnosis not present

## 2024-04-11 DIAGNOSIS — E6609 Other obesity due to excess calories: Secondary | ICD-10-CM

## 2024-04-11 DIAGNOSIS — Z7689 Persons encountering health services in other specified circumstances: Secondary | ICD-10-CM

## 2024-04-11 DIAGNOSIS — Z6830 Body mass index (BMI) 30.0-30.9, adult: Secondary | ICD-10-CM

## 2024-04-11 MED ORDER — PHENTERMINE HCL 15 MG PO CAPS: 15.0000 mg | ORAL_CAPSULE | ORAL | 0 refills | Status: DC

## 2024-04-11 NOTE — Progress Notes (Signed)
 New Patient Office Visit  Subjective   Patient ID: Patricia Mendez, female    DOB: 1981/03/22  Age: 43 y.o. MRN: [id number]  CC:  Chief Complaint  Patient presents with   Establish Care   Hypothyroidism   HPI Patricia Mendez is a 43 year old female who presents to establish with [facility] at [facility].   CC: Patient here to establish care  Last PCP: Iona Manis, FNP Specialists: OBGYN, urology  PMHx: hypothyroidism   Hypothyroidism: Synthroid  175mcg daily AM   Mood: Wellbutrin  300mg  daily  For weight loss also   Weight loss: she was on phentermine  in the past  Her weight fluctuates with her thyroid  levels  She is focused on healthy diet and exercise      04/11/2024    9:00 AM  GAD 7 : Generalized Anxiety Score  Nervous, Anxious, on Edge 0  Control/stop worrying 0  Worry too much - different things 0  Trouble relaxing 0  Restless 0  Easily annoyed or irritable 0  Afraid - awful might happen 0  Total GAD 7 Score 0  Anxiety Difficulty Not difficult at all      04/11/2024    9:00 AM 06/23/2023    8:16 AM 11/12/2022    9:09 AM  PHQ9 SCORE ONLY  PHQ-9 Total Score 0 0 0    Outpatient Encounter Medications as of 04/11/2024  Medication Sig   buPROPion  (WELLBUTRIN  XL) 300 MG 24 hr tablet Take 1 tablet (300 mg total) by mouth daily.   estradiol  (ESTRACE ) 0.1 MG/GM vaginal cream Place 0.5 g vaginally 2 (two) times a week. Place 0.5g nightly for two weeks then twice a week after   levothyroxine  (SYNTHROID ) 175 MCG tablet Take 1 tablet (175 mcg total) by mouth daily.   phentermine  15 MG capsule Take 1 capsule (15 mg total) by mouth every morning.   [DISCONTINUED] acetaminophen  (TYLENOL ) 500 MG tablet Take 1 tablet (500 mg total) by mouth every 6 (six) hours as needed (pain).   [DISCONTINUED] levothyroxine  (SYNTHROID ) 150 MCG tablet Take 1 tablet (150 mcg total) by mouth daily.   [DISCONTINUED] phentermine  37.5 MG capsule Take 1 capsule (37.5 mg total) by mouth  every morning.   [DISCONTINUED] polyethylene glycol powder (GLYCOLAX /MIRALAX ) 17 GM/SCOOP powder Take 17 g by mouth daily. Drink 17g (1 scoop) dissolved in water  per day.   No facility-administered encounter medications on file as of 04/11/2024.    Patient Active Problem List   Diagnosis Date Noted   Encounter for weight management 11/12/2022   Overweight with body mass index (BMI) of 29 to 29.9 in adult 11/12/2022   Elevated LDL cholesterol level 05/15/2022   Mirena  IUD (intrauterine device) in place since 07/2017 10/29/2021   ASCUS of cervix with negative high risk HPV on 10/29/2021 10/29/2021   Family history of ovarian cancer 05/17/2017   Acquired hypothyroidism 03/03/2016   Goiter, nontoxic, multinodular 03/03/2016   Bulge of lumbar disc without myelopathy 10/01/2014   Degeneration of intervertebral disc of lumbar region 09/10/2014   Neuritis or radiculitis due to rupture of lumbar intervertebral disc 09/10/2014   Past Medical History:  Diagnosis Date   ASCUS of cervix with negative high risk HPV on 10/29/2021 10/29/2021   Concussion 05/26/2021   From softball to face   COVID-19 11/2020   mild   Headache    bad headaches   History of abnormal cervical Pap smear    Hypothyroidism    Follows w/ PCP, Iona Manis, FNP @  [facility].   SUI (stress urinary incontinence, female) 2023   Thyroid  disease    hashimoto's, goiter   Past Surgical History:  Procedure Laterality Date   ABDOMINAL HYSTERECTOMY     CYSTOSCOPY N/A 02/15/2023   Procedure: CYSTOSCOPY;  Surgeon: Arma Lamp, MD;  Location: [facility];  Service: Gynecology;  Laterality: N/A;   INTRAUTERINE DEVICE (IUD) INSERTION  07/19/2012   Mirena    RECTOCELE REPAIR N/A 02/15/2023   Procedure: POSTERIOR REPAIR (RECTOCELE) AND PERINEORRHAPY;  Surgeon: Arma Lamp, MD;  Location: [facility] ;  Service: Gynecology;  Laterality: N/A;   THYROID  SURGERY  06/25/2009    bilateral FNA   XI ROBOTIC ASSISTED TOTAL HYSTERECTOMY WITH SACROCOLPOPEXY N/A 02/15/2023   Procedure: XI ROBOTIC ASSISTED TOTAL HYSTERECTOMY WITH BILATERAL SALPINGECTOMY AND SACROCOLPOPEXY;  Surgeon: Arma Lamp, MD;  Location: [facility];  Service: Gynecology;  Laterality: N/A;   Family History  Problem Relation Age of Onset   Hypertension Mother    Diabetes Father    Hypertension Father    Heart attack Father    Multiple sclerosis Sister    Ovarian cancer Paternal Aunt    Lymphoma Paternal Grandmother    Lung cancer Paternal Grandfather    Social History   Socioeconomic History   Marital status: Married    Spouse name: Not on file   Number of children: 1   Years of education: Not on file   Highest education level: Master's degree (e.g., MA, MS, MEng, MEd, MSW, MBA)  Occupational History   Occupation: IT trainer  Tobacco Use   Smoking status: Never    Passive exposure: Never   Smokeless tobacco: Never  Vaping Use   Vaping status: Never Used  Substance and Sexual Activity   Alcohol use: Yes    Alcohol/week: 4.0 standard drinks of alcohol    Types: 4 Cans of beer per week   Drug use: No   Sexual activity: Yes    Partners: Male    Birth control/protection: I.U.D.  Other Topics Concern   Not on file  Social History Narrative   Married since 2004.Lives with husband and daughter.Clinical Oceanographer.College,Masters in Nursing ,RN previously.   Social Drivers of Corporate investment banker Strain: Low Risk  (06/22/2023)   Overall Financial Resource Strain (CARDIA)    Difficulty of Paying Living Expenses: Not hard at all  Food Insecurity: No Food Insecurity (06/22/2023)   Hunger Vital Sign    Worried About Running Out of Food in the Last Year: Never true    Ran Out of Food in the Last Year: Never true  Transportation Needs: No Transportation Needs (06/22/2023)   PRAPARE - Administrator, Civil Service  (Medical): No    Lack of Transportation (Non-Medical): No  Physical Activity: Sufficiently Active (06/22/2023)   Exercise Vital Sign    Days of Exercise per Week: 4 days    Minutes of Exercise per Session: 60 min  Stress: Stress Concern Present (06/22/2023)   [employer] of Occupational Health - Occupational Stress Questionnaire    Feeling of Stress : To some extent  Social Connections: Moderately Isolated (06/22/2023)   Social Connection and Isolation Panel [NHANES]    Frequency of Communication with Friends and Family: Once a week    Frequency of Social Gatherings with Friends and Family: Once a week    Attends Religious Services: More than 4 times per year    Active Member of  Clubs or Organizations: No    Attends Engineer, structural: Not on file    Marital Status: Married  Catering manager Violence: Not on file   Outpatient Medications Prior to Visit  Medication Sig Dispense Refill   buPROPion  (WELLBUTRIN  XL) 300 MG 24 hr tablet Take 1 tablet (300 mg total) by mouth daily. 90 tablet 3   estradiol  (ESTRACE ) 0.1 MG/GM vaginal cream Place 0.5 g vaginally 2 (two) times a week. Place 0.5g nightly for two weeks then twice a week after 30 g 11   levothyroxine  (SYNTHROID ) 175 MCG tablet Take 1 tablet (175 mcg total) by mouth daily. 90 tablet 3   acetaminophen  (TYLENOL ) 500 MG tablet Take 1 tablet (500 mg total) by mouth every 6 (six) hours as needed (pain). 30 tablet 0   levothyroxine  (SYNTHROID ) 150 MCG tablet Take 1 tablet (150 mcg total) by mouth daily. 90 tablet 3   phentermine  37.5 MG capsule Take 1 capsule (37.5 mg total) by mouth every morning. 90 capsule 1   polyethylene glycol powder (GLYCOLAX /MIRALAX ) 17 GM/SCOOP powder Take 17 g by mouth daily. Drink 17g (1 scoop) dissolved in water  per day. 238 g 0   No facility-administered medications prior to visit.   No Known Allergies   ROS: see HPI     Objective   Today's Vitals   04/11/24 0851  BP: 134/73  Pulse:  65  Resp: 20  Temp: 98 F (36.7 C)  TempSrc: Oral  SpO2: 98%  Weight: 183 lb (83 kg)  Height: 5\' 5"  (1.651 m)  PainSc: 0-No pain   GENERAL: Well-appearing, in NAD. Well nourished.  SKIN: Pink, warm and dry. No rash, lesion, ulceration, or ecchymoses.  Head: Normocephalic. NECK: Trachea midline. Full ROM w/o pain or tenderness. No lymphadenopathy.  EARS: Tympanic membranes are intact, translucent without bulging and without drainage. Appropriate landmarks visualized.  EYES: Conjunctiva clear without exudates. EOMI, PERRL, no drainage present.  NOSE: Septum midline w/o deformity. Nares patent, mucosa pink and non-inflamed w/o drainage. No sinus tenderness.  THROAT: Uvula midline. Oropharynx clear. Tonsils non-inflamed without exudate. Mucous membranes pink and moist.  RESPIRATORY: Chest wall symmetrical. Respirations even and non-labored. Breath sounds clear to auscultation bilaterally.  CARDIAC: S1, S2 present, regular rate and rhythm without murmur or gallops. Peripheral pulses 2+ bilaterally.  MSK: Muscle tone and strength appropriate for age. Joints w/o tenderness, redness, or swelling.  EXTREMITIES: Without clubbing, cyanosis, or edema.  NEUROLOGIC: No motor or sensory deficits. Steady, even gait. C2-C12 intact.  PSYCH/MENTAL STATUS: Alert, oriented x 3. Cooperative, appropriate mood and affect.      Assessment & Plan:   1. Encounter to establish care (Primary) Patient is a 38- year-old female who presents today to establish care with primary care at [facility]. Reviewed the past medical history, family history, social history, surgical history, medications and allergies today- updates made as indicated. Patient has concerns today about *thyroid  levels and weight loss.   2. Class 1 obesity due to excess calories without serious comorbidity with body mass index (BMI) of 30.0 to 30.9 in adult BMI today is 30.45. Recent A1c of 5.5% completed on 06/23/2023. Discussed options, with  shared decision making, patient would like to restart phentermine  with lifestyle modifications. She reports that with phentermine , she was able to decrease her weight into the 170s pounds. She is interested possibly in GLP-1 receptor agonist- she will look into options with her insurance and different pricing options. Discussed potential risks and side effects. Educated patient regarding medication  side effects, rotating injection site, demonstrated how to utilize injectable pen, and advised patient to inform us  if he presents with any questions or concerns. Discussed that if he is tolerating this well, we can look to gradually increase the dose as soon as 4 weeks. Follow-up in 4 weeks.  - phentermine  15 MG capsule; Take 1 capsule (15 mg total) by mouth every morning.  Dispense: 30 capsule; Refill: 0  3. Acquired hypothyroidism Patient has a history of hypothyroidism with her thyroid  function being last checked in July 2024. Currently taking levothyroxine  175mcg. Will check TSH with reflux today.  - TSH Rfx on Abnormal to Free T4   Return in about 4 weeks (around 05/09/2024) for weight loss f/u .   Wilhelmena Hanson, FNP

## 2024-04-11 NOTE — Patient Instructions (Signed)

## 2024-04-12 ENCOUNTER — Ambulatory Visit: Payer: Self-pay | Admitting: Family Medicine

## 2024-04-12 LAB — TSH RFX ON ABNORMAL TO FREE T4: TSH: 0.848 u[IU]/mL (ref 0.450–4.500)

## 2024-05-12 ENCOUNTER — Ambulatory Visit (INDEPENDENT_AMBULATORY_CARE_PROVIDER_SITE_OTHER): Admitting: Family Medicine

## 2024-05-12 ENCOUNTER — Encounter: Payer: Self-pay | Admitting: Family Medicine

## 2024-05-12 ENCOUNTER — Other Ambulatory Visit (HOSPITAL_COMMUNITY): Payer: Self-pay

## 2024-05-12 DIAGNOSIS — Z6830 Body mass index (BMI) 30.0-30.9, adult: Secondary | ICD-10-CM

## 2024-05-12 DIAGNOSIS — E6609 Other obesity due to excess calories: Secondary | ICD-10-CM | POA: Insufficient documentation

## 2024-05-12 DIAGNOSIS — E66811 Obesity, class 1: Secondary | ICD-10-CM

## 2024-05-12 MED ORDER — PHENTERMINE HCL 30 MG PO CAPS
30.0000 mg | ORAL_CAPSULE | ORAL | 0 refills | Status: DC
  Filled 2024-05-12: qty 30, 30d supply, fill #0

## 2024-05-12 NOTE — Progress Notes (Signed)
   Established Patient Office Visit  Subjective  Patient ID: Patricia Mendez, female    DOB: 01-19-1981  Age: 43 y.o. MRN: [id number]  Chief Complaint  Patient presents with   Weight Management Screening   Patricia Mendez is a pleasant 43 year old female patient who presents today for weight loss management. She has been tolerating phentermine  15mg  daily. Denies adverse side effects- tachycardia, hypertension, heart palpitations, and insomnia. She is also taking Wellbutrin  300mg  for appetite suppression. She reports her mood is good and not the reason she is taking this medication. She has been trying to focus on healthy diet and exercise. She has been choosing healthier food options, consuming smaller portions, staying hydrated, and walking more.      05/12/2024    8:41 AM 04/11/2024    9:00 AM  GAD 7 : Generalized Anxiety Score  Nervous, Anxious, on Edge 0 0  Control/stop worrying 0 0  Worry too much - different things 0 0  Trouble relaxing 0 0  Restless 0 0  Easily annoyed or irritable 0 0  Afraid - awful might happen 0 0  Total GAD 7 Score 0 0  Anxiety Difficulty Not difficult at all Not difficult at all       05/12/2024    8:41 AM 04/11/2024    9:00 AM 06/23/2023    8:16 AM  PHQ9 SCORE ONLY  PHQ-9 Total Score 0 0 0    ROS: see HPI     Objective:     BP 113/79   Pulse 80   Temp 98.1 F (36.7 C) (Oral)   Ht 5' 5 (1.651 m)   Wt 182 lb (82.6 kg)   SpO2 98%   BMI 30.29 kg/m  BP Readings from Last 3 Encounters:  05/12/24 113/79  04/11/24 134/73  06/23/23 105/70     Physical Exam Vitals reviewed.  Constitutional:      Appearance: Normal appearance.   Cardiovascular:     Rate and Rhythm: Normal rate and regular rhythm.     Pulses: Normal pulses.     Heart sounds: Normal heart sounds.  Pulmonary:     Effort: Pulmonary effort is normal.     Breath sounds: Normal breath sounds.   Neurological:     Mental Status: She is alert.   Psychiatric:        Mood and  Affect: Mood normal.        Behavior: Behavior normal.       Assessment & Plan:  1. Class 1 obesity due to excess calories without serious comorbidity with body mass index (BMI) of 30.0 to 30.9 in adult BMI today 30.29. BMI last OV (5/20) was 30.45. She has lost about 1lb on low-dose phentermine . Discussed benefits of healthy lifestyle modifications- including choosing healthy nutrition options, avoiding high-carbohydrate and sugary drinks, and focusing on daily physical activity. Counseled patient about myFitness pal app to help monitor calories and macronutrients that help promote weight loss. PDMP reviewed, no red flags present. Vital signs stable. Rx sent to pharmacy for phentermine  30mg  daily. Will follow-up in 4 weeks and can increase at that time to the highest dose. Patient verbalized understanding and all questions answered.   - phentermine  30 MG capsule; Take 1 capsule (30 mg total) by mouth every morning.  Dispense: 30 capsule; Refill: 0  Return in about 4 weeks (around 06/09/2024) for weight loss f/u .    Patricia Hanson, FNP

## 2024-05-12 NOTE — Patient Instructions (Signed)

## 2024-06-06 ENCOUNTER — Encounter: Payer: Self-pay | Admitting: Family Medicine

## 2024-06-06 DIAGNOSIS — E039 Hypothyroidism, unspecified: Secondary | ICD-10-CM

## 2024-06-07 MED ORDER — LEVOTHYROXINE SODIUM 175 MCG PO TABS: 175.0000 ug | ORAL_TABLET | Freq: Every day | ORAL | 0 refills | Status: DC

## 2024-06-07 NOTE — Addendum Note (Signed)
 Addended by: RAYANN REXENE HERO on: 06/07/2024 10:28 AM   Modules accepted: Orders

## 2024-06-12 ENCOUNTER — Encounter: Payer: Self-pay | Admitting: Family Medicine

## 2024-06-12 ENCOUNTER — Ambulatory Visit (INDEPENDENT_AMBULATORY_CARE_PROVIDER_SITE_OTHER): Admitting: Family Medicine

## 2024-06-12 VITALS — BP 108/74 | HR 76 | Resp 16 | Ht 65.0 in | Wt 184.0 lb

## 2024-06-12 DIAGNOSIS — E559 Vitamin D deficiency, unspecified: Secondary | ICD-10-CM

## 2024-06-12 DIAGNOSIS — E66811 Obesity, class 1: Secondary | ICD-10-CM | POA: Diagnosis not present

## 2024-06-12 DIAGNOSIS — E6609 Other obesity due to excess calories: Secondary | ICD-10-CM | POA: Diagnosis not present

## 2024-06-12 DIAGNOSIS — Z0000 Encounter for general adult medical examination without abnormal findings: Secondary | ICD-10-CM

## 2024-06-12 DIAGNOSIS — Z6830 Body mass index (BMI) 30.0-30.9, adult: Secondary | ICD-10-CM

## 2024-06-12 MED ORDER — PHENTERMINE HCL 37.5 MG PO CAPS: 37.5000 mg | ORAL_CAPSULE | ORAL | 0 refills | Status: DC

## 2024-06-12 NOTE — Progress Notes (Signed)
   Established Patient Office Visit  Subjective  Patient ID: Patricia Mendez, female    DOB: 14-Aug-1981  Age: 43 y.o. MRN: [id number]  Chief Complaint  Patient presents with   Obesity     Would like to discuss tapering off Wellbutrin ?   Patricia Mendez is a pleasant 43 year old female patient who presents today for weight loss follow-up.  She has been tolerating phentermine  30mg  daily.  Last OV, her BMI was 30.29 and today it is 30.62.  She has noticed not overeating and is not having as many cravings.   Physical exercise: not regularly, moving more on a daily basis  Denies increase in anxiety, insomnia, palpitations.    ROS: see HPI     Objective:    BP 108/74   Pulse 76   Resp 16   Ht 5' 5 (1.651 m)   Wt 184 lb (83.5 kg)   SpO2 99%   BMI 30.62 kg/m  BP Readings from Last 3 Encounters:  06/12/24 108/74  05/12/24 113/79  04/11/24 134/73     Physical Exam Vitals reviewed.  Constitutional:      Appearance: Normal appearance.  Cardiovascular:     Rate and Rhythm: Normal rate and regular rhythm.     Pulses: Normal pulses.     Heart sounds: Normal heart sounds.  Pulmonary:     Effort: Pulmonary effort is normal.     Breath sounds: Normal breath sounds.  Neurological:     Mental Status: She is alert.  Psychiatric:        Mood and Affect: Mood normal.        Behavior: Behavior normal.     Assessment & Plan:    1. Class 1 obesity due to excess calories without serious comorbidity with body mass index (BMI) of 30.0 to 30.9 in adult (Primary) Patricia Mendez is a pleasant 43 year old female patient who presents today for weight loss follow-up. She has been tolerating phentermine  30mg  daily without adverse side effects. Her weight has been around the same for the past couple of visits. Patient reports that she stopped taking her Wellbutrin  300 mg XR a couple of days ago without any adverse side effects.  She reports that she would prefer to stay off of the medication.  Discussed withdrawal symptoms and advised patient to reach out if she experiences these so that we could wean her down to 150 mg XR for 7-14 days. Discussed lifestyle modifications- including healthy nutrition options and daily physical exercise. Vital signs stable. PDMP reviewed, no red flags present. Rx sent to pharmacy on file. Will follow-up in 4 weeks.   - phentermine  37.5 MG capsule; Take 1 capsule (37.5 mg total) by mouth every morning.  Dispense: 30 capsule; Refill: 0  2. Wellness examination Will complete fasting labs prior to upcoming physical.  - CBC with Differential/Platelet; Future - Comprehensive metabolic panel with GFR; Future - Hemoglobin A1c; Future - Lipid panel; Future - TSH Rfx on Abnormal to Free T4; Future - VITAMIN D 25 Hydroxy (Vit-D Deficiency, Fractures); Future  3. Vitamin D deficiency History of vitamin D deficiency. Will obtain with routine labs.  - VITAMIN D 25 Hydroxy (Vit-D Deficiency, Fractures); Future   Return in about 4 weeks (around 07/10/2024) for Physical with fasting labs.    Patricia Arts, FNP

## 2024-06-12 NOTE — Patient Instructions (Addendum)
 If on Wellbutrin  XL 300 mg once daily: Step 1: Reduce to 150 mg once daily for 7-14 days Step 2: Stop after that period if well tolerated

## 2024-07-05 ENCOUNTER — Encounter: Payer: Self-pay | Admitting: Family Medicine

## 2024-07-07 ENCOUNTER — Ambulatory Visit

## 2024-07-07 DIAGNOSIS — Z0000 Encounter for general adult medical examination without abnormal findings: Secondary | ICD-10-CM

## 2024-07-07 DIAGNOSIS — E559 Vitamin D deficiency, unspecified: Secondary | ICD-10-CM

## 2024-07-08 ENCOUNTER — Encounter: Payer: Self-pay | Admitting: Family Medicine

## 2024-07-08 LAB — LIPID PANEL
Chol/HDL Ratio: 3.3 ratio (ref 0.0–4.4)
Cholesterol, Total: 178 mg/dL (ref 100–199)
HDL: 54 mg/dL (ref >39)
LDL Chol Calc (NIH): 113 mg/dL — ABNORMAL HIGH (ref 0–99)
Triglycerides: 58 mg/dL (ref 0–149)
VLDL Cholesterol Cal: 11 mg/dL (ref 5–40)

## 2024-07-08 LAB — COMPREHENSIVE METABOLIC PANEL WITH GFR
ALT: 13 IU/L (ref 0–32)
AST: 13 IU/L (ref 0–40)
Albumin: 4.4 g/dL (ref 3.9–4.9)
Alkaline Phosphatase: 54 IU/L (ref 44–121)
BUN/Creatinine Ratio: 10 (ref 9–23)
BUN: 9 mg/dL (ref 6–24)
Bilirubin Total: 0.4 mg/dL (ref 0.0–1.2)
CO2: 23 mmol/L (ref 20–29)
Calcium: 9.1 mg/dL (ref 8.7–10.2)
Chloride: 102 mmol/L (ref 96–106)
Creatinine, Ser: 0.9 mg/dL (ref 0.57–1.00)
Globulin, Total: 2.1 g/dL (ref 1.5–4.5)
Glucose: 84 mg/dL (ref 70–99)
Potassium: 4.7 mmol/L (ref 3.5–5.2)
Sodium: 139 mmol/L (ref 134–144)
Total Protein: 6.5 g/dL (ref 6.0–8.5)
eGFR: 81 mL/min/1.73 (ref >59)

## 2024-07-08 LAB — CBC WITH DIFFERENTIAL/PLATELET
Basophils Absolute: 0 x10E3/uL (ref 0.0–0.2)
Basos: 1 %
EOS (ABSOLUTE): 0.1 x10E3/uL (ref 0.0–0.4)
Eos: 2 %
Hematocrit: 42.1 % (ref 34.0–46.6)
Hemoglobin: 13.4 g/dL (ref 11.1–15.9)
Immature Grans (Abs): 0 x10E3/uL (ref 0.0–0.1)
Immature Granulocytes: 0 %
Lymphocytes Absolute: 1.3 x10E3/uL (ref 0.7–3.1)
Lymphs: 27 %
MCH: 29.7 pg (ref 26.6–33.0)
MCHC: 31.8 g/dL (ref 31.5–35.7)
MCV: 93 fL (ref 79–97)
Monocytes Absolute: 0.5 x10E3/uL (ref 0.1–0.9)
Monocytes: 11 %
Neutrophils Absolute: 2.8 x10E3/uL (ref 1.4–7.0)
Neutrophils: 59 %
Platelets: 289 x10E3/uL (ref 150–450)
RBC: 4.51 x10E6/uL (ref 3.77–5.28)
RDW: 12.5 % (ref 11.7–15.4)
WBC: 4.7 x10E3/uL (ref 3.4–10.8)

## 2024-07-08 LAB — TSH RFX ON ABNORMAL TO FREE T4: TSH: 0.56 u[IU]/mL (ref 0.450–4.500)

## 2024-07-08 LAB — HEMOGLOBIN A1C
Est. average glucose Bld gHb Est-mCnc: 105 mg/dL
Hgb A1c MFr Bld: 5.3 % (ref 4.8–5.6)

## 2024-07-08 LAB — VITAMIN D 25 HYDROXY (VIT D DEFICIENCY, FRACTURES): Vit D, 25-Hydroxy: 43.3 ng/mL (ref 30.0–100.0)

## 2024-07-10 ENCOUNTER — Ambulatory Visit (INDEPENDENT_AMBULATORY_CARE_PROVIDER_SITE_OTHER): Admitting: Family Medicine

## 2024-07-10 ENCOUNTER — Encounter: Payer: Self-pay | Admitting: Family Medicine

## 2024-07-10 VITALS — BP 98/65 | HR 75 | Resp 16 | Ht 65.0 in | Wt 185.8 lb

## 2024-07-10 DIAGNOSIS — E6609 Other obesity due to excess calories: Secondary | ICD-10-CM

## 2024-07-10 DIAGNOSIS — Z0000 Encounter for general adult medical examination without abnormal findings: Secondary | ICD-10-CM | POA: Diagnosis not present

## 2024-07-10 DIAGNOSIS — Z23 Encounter for immunization: Secondary | ICD-10-CM

## 2024-07-10 DIAGNOSIS — E66811 Obesity, class 1: Secondary | ICD-10-CM

## 2024-07-10 DIAGNOSIS — Z1231 Encounter for screening mammogram for malignant neoplasm of breast: Secondary | ICD-10-CM

## 2024-07-10 DIAGNOSIS — Z6830 Body mass index (BMI) 30.0-30.9, adult: Secondary | ICD-10-CM | POA: Diagnosis not present

## 2024-07-10 MED ORDER — PHENTERMINE HCL 37.5 MG PO CAPS: 37.5000 mg | ORAL_CAPSULE | ORAL | 0 refills | Status: DC

## 2024-07-10 NOTE — Addendum Note (Signed)
 Addended by: RAYANN REXENE HERO on: 07/10/2024 09:21 AM   Modules accepted: Orders

## 2024-07-10 NOTE — Patient Instructions (Signed)
 Health Maintenance Recommendations Screening Testing Mammogram Every 1 -2 years based on history and risk factors Starting at age 44 Pap Smear Ages 21-39 every 3 years Ages 23-65 every 5 years with HPV testing More frequent testing may be required based on results and history Colon Cancer Screening Every 1-10 years based on test performed, risk factors, and history Starting at age 102 Bone Density Screening Every 2-10 years based on history Starting at age 69 for women Recommendations for men differ based on medication usage, history, and risk factors AAA Screening One time ultrasound Men 30-10 years old who have every smoked Lung Cancer Screening Low Dose Lung CT every 12 months Age 20-80 years with a 30 pack-year smoking history who still smoke or who have quit within the last 15 years   Screening Labs Routine  Labs: Complete Blood Count (CBC), Complete Metabolic Panel (CMP), Cholesterol (Lipid Panel) Every 6-12 months based on history and medications May be recommended more frequently based on current conditions or previous results Hemoglobin A1c Lab Every 3-12 months based on history and previous results Starting at age 24 or earlier with diagnosis of diabetes, high cholesterol, BMI >26, and/or risk factors Frequent monitoring for patients with diabetes to ensure blood sugar control Thyroid Panel (TSH w/ T3 & T4) Every 6 months based on history, symptoms, and risk factors May be repeated more often if on medication HIV One time testing for all patients 23 and older May be repeated more frequently for patients with increased risk factors or exposure Hepatitis C One time testing for all patients 47 and older May be repeated more frequently for patients with increased risk factors or exposure Gonorrhea, Chlamydia Every 12 months for all sexually active persons 13-24 years Additional monitoring may be recommended for those who are considered high risk or who have  symptoms PSA Men 72-66 years old with risk factors Additional screening may be recommended from age 2-69 based on risk factors, symptoms, and history   Vaccine Recommendations Tetanus Booster All adults every 10 years Flu Vaccine All patients 6 months and older every year COVID Vaccine All patients 12 years and older Initial dosing with booster May recommend additional booster based on age and health history HPV Vaccine 2 doses all patients age 56-26 Dosing may be considered for patients over 26 Shingles Vaccine (Shingrix) 2 doses all adults 55 years and older Pneumonia (Pneumovax 23) All adults 65 years and older May recommend earlier dosing based on health history Pneumonia (Prevnar 16) All adults 65 years and older Dosed 1 year after Pneumovax 23   Additional Screening, Testing, and Vaccinations may be recommended on an individualized basis based on family history, health history, risk factors, and/or exposure.  __________________________________________________________   Diet Recommendations for All Patients   I recommend that all patients maintain a diet low in saturated fats, carbohydrates, and cholesterol. While this can be challenging at first, it is not impossible and small changes can make big differences.  Things to try: Decreasing the amount of soda, sweet tea, and/or juice to one or less per day and replace with water While water is always the first choice, if you do not like water you may consider adding a water additive without sugar to improve the taste other sugar free drinks Replace potatoes with a brightly colored vegetable at dinner Use healthy oils, such as canola oil or olive oil, instead of butter or hard margarine Limit your bread intake to two pieces or less a day Replace regular pasta with  low carb pasta options Bake, broil, or grill foods instead of frying Monitor portion sizes  Eat smaller, more frequent meals throughout the day instead of large  meals   An important thing to remember is, if you love foods that are not great for your health, you don't have to give them up completely. Instead, allow these foods to be a reward when you have done well. Allowing yourself to still have special treats every once in a while is a nice way to tell yourself thank you for working hard to keep yourself healthy.    Also remember that every day is a new day. If you have a bad day and "fall off the wagon", you can still climb right back up and keep moving along on your journey!   We have resources available to help you!  Some websites that may be helpful include: www.http://www.wall-moore.info/        Www.VeryWellFit.com _____________________________________________________________   Activity Recommendations for All Patients   I recommend that all adults get at least 20 minutes of moderate physical activity that elevates your heart rate at least 5 days out of the week.  Some examples include: Walking or jogging at a pace that allows you to carry on a conversation Cycling (stationary bike or outdoors) Water aerobics Yoga Weight lifting Dancing If physical limitations prevent you from putting stress on your joints, exercise in a pool or seated in a chair are excellent options.   Do determine your MAXIMUM heart rate for activity: YOUR AGE - 220 = MAX HeartRate    Remember! Do not push yourself too hard.  Start slowly and build up your pace, speed, weight, time in exercise, etc.  Allow your body to rest between exercise and get good sleep. You will need more water than normal when you are exerting yourself. Do not wait until you are thirsty to drink. Drink with a purpose of getting in at least 8, 8 ounce glasses of water a day plus more depending on how much you exercise and sweat.      If you begin to develop dizziness, chest pain, abdominal pain, jaw pain, shortness of breath, headache, vision changes, lightheadedness, or other concerning symptoms, stop the  activity and allow your body to rest. If your symptoms are severe, seek emergency evaluation immediately. If your symptoms are concerning, but not severe, please let us know so that we can recommend further evaluation.

## 2024-07-10 NOTE — Progress Notes (Signed)
 Subjective:   Patricia Mendez October 20, 1981  07/10/2024   CC: Chief Complaint  Patient presents with   Annual Exam   HPI: Patricia Mendez is a 43 y.o. female who presents for a routine health maintenance exam.  Fasting labs collected prior to visit.  Diet: appetite has decreased, not always choosing healthiest options  Exercise: has a standing desk with a walking pad   HEALTH SCREENINGS: - Vision Screening: plans to update  - Dental Visits: up to date - Pap smear: up to date - Breast Exam: discussed SBEs - STD Screening: Declined - Mammogram (40+): Ordered today  - Colonoscopy (45+): Not applicable  - Bone Density (65+ or under 65 with predisposing conditions): Not applicable  - Lung CA screening with low-dose CT:  Not applicable Adults age 65-80 who are current cigarette smokers or quit within the last 15 years. Must have 20 pack year history.   Depression and Anxiety Screen done today and results listed below:     07/10/2024    8:35 AM 06/12/2024    8:37 AM 05/12/2024    8:41 AM 04/11/2024    9:00 AM 06/23/2023    8:16 AM  Depression screen PHQ 2/9  Decreased Interest 0 0 0 0 0  Down, Depressed, Hopeless 0 0 0 0 0  PHQ - 2 Score 0 0 0 0 0  Altered sleeping 0 0 0 0   Tired, decreased energy 0 0 0 0   Change in appetite 0 0 0 0   Feeling bad or failure about yourself  0 0 0 0   Trouble concentrating 0 0 0 0   Moving slowly or fidgety/restless 0 0 0 0   Suicidal thoughts 0 0 0 0   PHQ-9 Score 0 0 0 0   Difficult doing work/chores Not difficult at all Not difficult at all Not difficult at all Not difficult at all       07/10/2024    8:36 AM 06/12/2024    8:37 AM 05/12/2024    8:41 AM 04/11/2024    9:00 AM  GAD 7 : Generalized Anxiety Score  Nervous, Anxious, on Edge 0 0 0 0  Control/stop worrying 0 0 0 0  Worry too much - different things 0 0 0 0  Trouble relaxing 0 0 0 0  Restless 0 0 0 0  Easily annoyed or irritable 0 0 0 0  Afraid - awful might happen 0 0 0 0   Total GAD 7 Score 0 0 0 0  Anxiety Difficulty Not difficult at all Not difficult at all Not difficult at all Not difficult at all    IMMUNIZATIONS: - Tdap: Tetanus vaccination status reviewed: Td vaccination indicated and given today. - HPV: Not applicable - Influenza: N/A - Pneumovax: Not applicable - Prevnar 20: Not applicable - Zostavax (50+): Not applicable   Past medical history, surgical history, medications, allergies, family history and social history reviewed with patient today and changes made to appropriate areas of the chart.   Past Medical History:  Diagnosis Date   ASCUS of cervix with negative high risk HPV on 10/29/2021 10/29/2021   Concussion 05/26/2021   From softball to face   COVID-19 11/2020   mild   Headache    bad headaches   History of abnormal cervical Pap smear    Hypothyroidism    Follows w/ PCP, Kelly Cedar, FNP @ [facility].   Mirena  IUD (intrauterine device) in place since 07/2017 10/29/2021  SUI (stress urinary incontinence, female) 2023   Thyroid  disease    hashimoto's, goiter    Past Surgical History:  Procedure Laterality Date   ABDOMINAL HYSTERECTOMY     CYSTOSCOPY N/A 02/15/2023   Procedure: CYSTOSCOPY;  Surgeon: Marilynne Rosaline SAILOR, MD;  Location: [facility];  Service: Gynecology;  Laterality: N/A;   INTRAUTERINE DEVICE (IUD) INSERTION  07/19/2012   Mirena    RECTOCELE REPAIR N/A 02/15/2023   Procedure: POSTERIOR REPAIR (RECTOCELE) AND PERINEORRHAPY;  Surgeon: Marilynne Rosaline SAILOR, MD;  Location: [facility];  Service: Gynecology;  Laterality: N/A;   THYROID  SURGERY  06/25/2009   bilateral FNA   XI ROBOTIC ASSISTED TOTAL HYSTERECTOMY WITH SACROCOLPOPEXY N/A 02/15/2023   Procedure: XI ROBOTIC ASSISTED TOTAL HYSTERECTOMY WITH BILATERAL SALPINGECTOMY AND SACROCOLPOPEXY;  Surgeon: Marilynne Rosaline SAILOR, MD;  Location: [facility];  Service: Gynecology;  Laterality: N/A;     Current Outpatient Medications on File Prior to Visit  Medication Sig   levothyroxine  (SYNTHROID ) 175 MCG tablet Take 1 tablet (175 mcg total) by mouth daily.   phentermine  37.5 MG capsule Take 1 capsule (37.5 mg total) by mouth every morning.   No current facility-administered medications on file prior to visit.    No Known Allergies   Social History   Socioeconomic History   Marital status: Married    Spouse name: Not on file   Number of children: 1   Years of education: Not on file   Highest education level: Master's degree (e.g., MA, MS, MEng, MEd, MSW, MBA)  Occupational History   Occupation: IT trainer  Tobacco Use   Smoking status: Never    Passive exposure: Never   Smokeless tobacco: Never  Vaping Use   Vaping status: Never Used  Substance and Sexual Activity   Alcohol use: Yes    Alcohol/week: 4.0 standard drinks of alcohol    Types: 4 Cans of beer per week   Drug use: No   Sexual activity: Yes    Partners: Male    Birth control/protection: I.U.D.  Other Topics Concern   Not on file  Social History Narrative   Married since 2004.Lives with husband and daughter.Clinical Oceanographer.College,Masters in Nursing ,RN previously.   Social Drivers of Corporate investment banker Strain: Low Risk  (06/11/2024)   Overall Financial Resource Strain (CARDIA)    Difficulty of Paying Living Expenses: Not hard at all  Food Insecurity: No Food Insecurity (06/11/2024)   Hunger Vital Sign    Worried About Running Out of Food in the Last Year: Never true    Ran Out of Food in the Last Year: Never true  Transportation Needs: No Transportation Needs (06/11/2024)   PRAPARE - Administrator, Civil Service (Medical): No    Lack of Transportation (Non-Medical): No  Physical Activity: Insufficiently Active (06/11/2024)   Exercise Vital Sign    Days of Exercise per Week: 3 days    Minutes of Exercise per Session: 30 min  Stress: Stress  Concern Present (06/11/2024)   [employer] of Occupational Health - Occupational Stress Questionnaire    Feeling of Stress: To some extent  Social Connections: Moderately Integrated (06/11/2024)   Social Connection and Isolation Panel    Frequency of Communication with Friends and Family: More than three times a week    Frequency of Social Gatherings with Friends and Family: Once a week    Attends Religious Services: More than 4 times per year  Active Member of Clubs or Organizations: No    Attends Engineer, structural: Not on file    Marital Status: Married  Catering manager Violence: Not on file   Social History   Tobacco Use  Smoking Status Never   Passive exposure: Never  Smokeless Tobacco Never   Social History   Substance and Sexual Activity  Alcohol Use Yes   Alcohol/week: 4.0 standard drinks of alcohol   Types: 4 Cans of beer per week    Family History  Problem Relation Age of Onset   Hypertension Mother    Diabetes Father    Hypertension Father    Heart attack Father    Stroke Father    Multiple sclerosis Sister    Ovarian cancer Paternal Aunt    Lymphoma Paternal Grandmother    Lung cancer Paternal Grandfather      ROS: Denies fever, fatigue, unexplained weight loss/gain, chest pain, SHOB, and palpitations. Denies neurological deficits, gastrointestinal or genitourinary complaints, and skin changes.   Objective:   Today's Vitals   07/10/24 0836  BP: 98/65  Pulse: 75  Resp: 16  Weight: 185 lb 12.8 oz (84.3 kg)  Height: 5' 5 (1.651 m)  PainSc: 0-No pain    GENERAL APPEARANCE: Well-appearing, in NAD. Well nourished.  SKIN: Pink, warm and dry. Turgor normal. No rash, lesion, ulceration, or ecchymoses. Hair evenly distributed.  HEENT: HEAD: Normocephalic.  EYES: PERRLA. EOMI. Lids intact w/o defect. Sclera white, Conjunctiva pink w/o exudate.  EARS: External ear w/o redness, swelling, masses or lesions. EAC clear. TM's intact,  translucent w/o bulging, appropriate landmarks visualized. Appropriate acuity to conversational tones.  NOSE: Septum midline w/o deformity. Nares patent, mucosa pink and non-inflamed w/o drainage. No sinus tenderness.  THROAT: Uvula midline. Oropharynx clear. Tonsils non-inflamed w/o exudate. Oral mucosa pink and moist.  NECK: Supple, Trachea midline. Full ROM w/o pain or tenderness. No lymphadenopathy. Thyroid  non-tender w/o enlargement or palpable masses.  BREASTS: Deferred. RESP: Breath sounds clear to auscultation bilaterally. No wheezes, rales, rhonchi, or crackles. CARDIAC: S1, S2 present, regular rate and rhythm. No gallops, murmurs, rubs, or clicks. PMI w/o lifts, heaves, or thrills. No carotid bruits. Capillary refill <2 seconds. Peripheral pulses 2+ bilaterally. GI: Abdomen soft w/o distention. Normoactive bowel sounds. No palpable masses or tenderness. No guarding or rebound tenderness. Liver and spleen w/o tenderness or enlargement. No CVA tenderness.  GU: Deferred.  MSK: Muscle tone and strength appropriate for age, w/o atrophy or abnormal movement.  EXTREMITIES: Active ROM intact, w/o tenderness, crepitus, or contracture. No obvious joint deformities or effusions. No clubbing, edema, or cyanosis.  NEUROLOGIC: CN's II-XII intact. Motor strength symmetrical with no obvious weakness. No sensory deficits. DTR's 2+ symmetric bilaterally. Steady, even gait.  PSYCH/MENTAL STATUS: Alert, oriented x 3. Cooperative, appropriate mood and affect.   Results for orders placed or performed in visit on 07/07/24  VITAMIN D  25 Hydroxy (Vit-D Deficiency, Fractures)   Collection Time: 07/07/24  8:50 AM  Result Value Ref Range   Vit D, 25-Hydroxy 43.3 30.0 - 100.0 ng/mL  TSH Rfx on Abnormal to Free T4   Collection Time: 07/07/24  8:50 AM  Result Value Ref Range   TSH 0.560 0.450 - 4.500 uIU/mL  Lipid panel   Collection Time: 07/07/24  8:50 AM  Result Value Ref Range   Cholesterol, Total 178 100 -  199 mg/dL   Triglycerides 58 0 - 149 mg/dL   HDL 54 >60 mg/dL   VLDL Cholesterol Cal 11 5 -  40 mg/dL   LDL Chol Calc (NIH) 886 (H) 0 - 99 mg/dL   Chol/HDL Ratio 3.3 0.0 - 4.4 ratio  Hemoglobin A1c   Collection Time: 07/07/24  8:50 AM  Result Value Ref Range   Hgb A1c MFr Bld 5.3 4.8 - 5.6 %   Est. average glucose Bld gHb Est-mCnc 105 mg/dL  Comprehensive metabolic panel with GFR   Collection Time: 07/07/24  8:50 AM  Result Value Ref Range   Glucose 84 70 - 99 mg/dL   BUN 9 6 - 24 mg/dL   Creatinine, Ser 9.09 0.57 - 1.00 mg/dL   eGFR 81 >40 fO/fpw/8.26   BUN/Creatinine Ratio 10 9 - 23   Sodium 139 134 - 144 mmol/L   Potassium 4.7 3.5 - 5.2 mmol/L   Chloride 102 96 - 106 mmol/L   CO2 23 20 - 29 mmol/L   Calcium 9.1 8.7 - 10.2 mg/dL   Total Protein 6.5 6.0 - 8.5 g/dL   Albumin 4.4 3.9 - 4.9 g/dL   Globulin, Total 2.1 1.5 - 4.5 g/dL   Bilirubin Total 0.4 0.0 - 1.2 mg/dL   Alkaline Phosphatase 54 44 - 121 IU/L   AST 13 0 - 40 IU/L   ALT 13 0 - 32 IU/L  CBC with Differential/Platelet   Collection Time: 07/07/24  8:50 AM  Result Value Ref Range   WBC 4.7 3.4 - 10.8 x10E3/uL   RBC 4.51 3.77 - 5.28 x10E6/uL   Hemoglobin 13.4 11.1 - 15.9 g/dL   Hematocrit 57.8 65.9 - 46.6 %   MCV 93 79 - 97 fL   MCH 29.7 26.6 - 33.0 pg   MCHC 31.8 31.5 - 35.7 g/dL   RDW 87.4 88.2 - 84.5 %   Platelets 289 150 - 450 x10E3/uL   Neutrophils 59 Not Estab. %   Lymphs 27 Not Estab. %   Monocytes 11 Not Estab. %   Eos 2 Not Estab. %   Basos 1 Not Estab. %   Neutrophils Absolute 2.8 1.4 - 7.0 x10E3/uL   Lymphocytes Absolute 1.3 0.7 - 3.1 x10E3/uL   Monocytes Absolute 0.5 0.1 - 0.9 x10E3/uL   EOS (ABSOLUTE) 0.1 0.0 - 0.4 x10E3/uL   Basophils Absolute 0.0 0.0 - 0.2 x10E3/uL   Immature Granulocytes 0 Not Estab. %   Immature Grans (Abs) 0.0 0.0 - 0.1 x10E3/uL    Assessment & Plan:   1. Wellness examination (Primary) - Encouraged a healthy well-balanced diet. Patient may adjust caloric intake to  maintain or achieve ideal body weight. May reduce intake of dietary saturated fat and total fat and have adequate dietary potassium and calcium preferably from fresh fruits, vegetables, and low-fat dairy products.   - Advised to avoid cigarette smoking. - Discussed with the patient that most people either abstain from alcohol or drink within safe limits (<=14/week and <=4 drinks/occasion for males, <=7/weeks and <= 3 drinks/occasion for females) and that the risk for alcohol disorders and other health effects rises proportionally with the number of drinks per week and how often a drinker exceeds daily limits. - Discussed cessation/primary prevention of drug use and availability of treatment for abuse.  - Discussed sexually transmitted diseases, avoidance of unintended pregnancy and contraceptive alternatives. - Stressed the importance of regular exercise - Injury prevention: Discussed safety belts, safety helmets, smoke detector, smoking near bedding or upholstery.  - Dental health: Discussed importance of regular tooth brushing, flossing, and dental visits.  NEXT PREVENTATIVE PHYSICAL DUE IN 1 YEAR.  2.  Screening mammogram for breast cancer Order placed for screening mammogram.  - MM 3D SCREENING MAMMOGRAM BILATERAL BREAST; Future  3. Class 1 obesity due to excess calories without serious comorbidity with body mass index (BMI) of 30.0 to 30.9 in adult Discussed healthy lifestyle modifications. PDMP reviewed, no red flags. Rx sent to pharmacy on file.  - phentermine  37.5 MG capsule; Take 1 capsule (37.5 mg total) by mouth every morning.  Dispense: 30 capsule; Refill: 0  4. Need for diphtheria-tetanus-pertussis (Tdap) vaccine Patient agreeable to receive Tdap today. VIS provided to patient.   Return in about 3 months (around 10/10/2024) for weight loss management .  Patient to reach out to office if new, worrisome, or unresolved symptoms arise or if no improvement in patient's condition.  Patient verbalized understanding and is agreeable to treatment plan. All questions answered to patient's satisfaction.   Evalene Arts, FNP

## 2024-08-02 ENCOUNTER — Other Ambulatory Visit: Payer: Self-pay

## 2024-08-02 ENCOUNTER — Telehealth: Admitting: Nurse Practitioner

## 2024-08-02 DIAGNOSIS — B001 Herpesviral vesicular dermatitis: Secondary | ICD-10-CM

## 2024-08-02 MED ORDER — VALACYCLOVIR HCL 1 G PO TABS
2000.0000 mg | ORAL_TABLET | Freq: Two times a day (BID) | ORAL | 0 refills | Status: DC
  Filled 2024-08-02: qty 4, 1d supply, fill #0

## 2024-08-02 NOTE — Progress Notes (Signed)
 I have spent 5 minutes in review of e-visit questionnaire, review and updating patient chart, medical decision making and response to patient.   Elsie Velma Lunger, PA-C

## 2024-08-02 NOTE — Progress Notes (Signed)

## 2024-08-03 ENCOUNTER — Other Ambulatory Visit (HOSPITAL_COMMUNITY): Payer: Self-pay

## 2024-08-03 MED ORDER — VALACYCLOVIR HCL 1 G PO TABS
2000.0000 mg | ORAL_TABLET | Freq: Two times a day (BID) | ORAL | 1 refills | Status: AC
  Filled 2024-08-03: qty 4, 1d supply, fill #0

## 2024-08-03 NOTE — Addendum Note (Signed)
 Addended by: Leith Szafranski on: 08/03/2024 08:26 AM   Modules accepted: Orders

## 2024-08-04 ENCOUNTER — Telehealth: Admitting: Emergency Medicine

## 2024-08-04 DIAGNOSIS — L0100 Impetigo, unspecified: Secondary | ICD-10-CM

## 2024-08-04 DIAGNOSIS — B001 Herpesviral vesicular dermatitis: Secondary | ICD-10-CM | POA: Diagnosis not present

## 2024-08-04 MED ORDER — VALACYCLOVIR HCL 1 G PO TABS: 1000.0000 mg | ORAL_TABLET | Freq: Three times a day (TID) | ORAL | 0 refills | Status: AC

## 2024-08-04 MED ORDER — DOXYCYCLINE HYCLATE 100 MG PO CAPS: 100.0000 mg | ORAL_CAPSULE | Freq: Two times a day (BID) | ORAL | 0 refills | Status: AC

## 2024-08-04 MED ORDER — MUPIROCIN 2 % EX OINT: 1.0000 | TOPICAL_OINTMENT | Freq: Two times a day (BID) | CUTANEOUS | 0 refills | Status: AC

## 2024-08-04 NOTE — Progress Notes (Signed)
 Virtual Visit Consent   Patricia Mendez, you are scheduled for a virtual visit with a [facility] provider today. Just as with appointments in the office, your consent must be obtained to participate. Your consent will be active for this visit and any virtual visit you may have with one of our providers in the next 365 days. If you have a MyChart account, a copy of this consent can be sent to you electronically.  As this is a virtual visit, video technology does not allow for your provider to perform a traditional examination. This may limit your provider's ability to fully assess your condition. If your provider identifies any concerns that need to be evaluated in person or the need to arrange testing (such as labs, EKG, etc.), we will make arrangements to do so. Although advances in technology are sophisticated, we cannot ensure that it will always work on either your end or our end. If the connection with a video visit is poor, the visit may have to be switched to a telephone visit. With either a video or telephone visit, we are not always able to ensure that we have a secure connection.  By engaging in this virtual visit, you consent to the provision of healthcare and authorize for your insurance to be billed (if applicable) for the services provided during this visit. Depending on your insurance coverage, you may receive a charge related to this service.  I need to obtain your verbal consent now. Are you willing to proceed with your visit today? Patricia Mendez has provided verbal consent on 08/04/2024 for a virtual visit (video or telephone). Lamar Schlossman, PA-C  Date: 08/04/2024 10:25 AM   Virtual Visit via Video Note   I, Lamar Schlossman, connected with  Patricia Mendez  ([id number], 1980/12/08) on 08/04/24 at 10:00 AM EDT by a video-enabled telemedicine application and verified that I am speaking with the correct person using two identifiers.  Location: Patient: Virtual Visit Location  Patient: Home Provider: Virtual Visit Location Provider: Home Office   I discussed the limitations of evaluation and management by telemedicine and the availability of in person appointments. The patient expressed understanding and agreed to proceed.    History of Present Illness: Patricia Mendez is a 43 y.o. who identifies as a female who was assigned female at birth, and is being seen today for rash on lip and right side of cheek/chin.  States that she has frequent cold sores.  States that this is much worse than her normal cold sores.  Reports feeling generalized body aches and fatigue.  Denies measured fever.  Has taken acyclovir  x 2 doses.  She states that she has begun having symptoms on the left side of her face as well.  She states that it is painful.  She states that she has had some oozing and discharge.  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   Bulge of lumbar disc without myelopathy 10/01/2014   Degeneration of intervertebral disc of lumbar region 09/10/2014   Neuritis or radiculitis due to rupture of lumbar intervertebral disc 09/10/2014    Allergies: No Known Allergies Medications:  Current Outpatient Medications:    doxycycline  (VIBRAMYCIN ) 100 MG capsule, Take  1 capsule (100 mg total) by mouth 2 (two) times daily., Disp: 20 capsule, Rfl: 0   mupirocin  ointment (BACTROBAN ) 2 %, Apply 1 Application topically 2 (two) times daily., Disp: 22 g, Rfl: 0   valACYclovir  (VALTREX ) 1000 MG tablet, Take 1 tablet (1,000 mg total) by mouth 3 (three) times daily., Disp: 21 tablet, Rfl: 0   levothyroxine  (SYNTHROID ) 175 MCG tablet, Take 1 tablet (175 mcg total) by mouth daily., Disp: 90 tablet, Rfl: 0    phentermine  37.5 MG capsule, Take 1 capsule (37.5 mg total) by mouth every morning., Disp: 30 capsule, Rfl: 0   valACYclovir  (VALTREX ) 1000 MG tablet, Take 2 tablets (2,000 mg total) by mouth 2 (two) times daily., Disp: 4 tablet, Rfl: 1  Observations/Objective: Patient is well-developed, well-nourished in no acute distress.  Resting comfortably at home.  Head is normocephalic, atraumatic.  No labored breathing.  Speech is clear and coherent with logical content.  Patient is alert and oriented at baseline.  There is rash to the right lower cheek and chin with extension onto the lower lip  Assessment and Plan: 1. Cold sore (Primary)  2. Impetigo   Meds ordered this encounter  Medications   mupirocin  ointment (BACTROBAN ) 2 %    Sig: Apply 1 Application topically 2 (two) times daily.    Dispense:  22 g    Refill:  0    Supervising Provider:   BLAISE ALEENE KIDD [[id number]]   valACYclovir  (VALTREX ) 1000 MG tablet    Sig: Take 1 tablet (1,000 mg total) by mouth 3 (three) times daily.    Dispense:  21 tablet    Refill:  0    Supervising Provider:   LAMPTEY, PHILIP O [[id number]]   doxycycline  (VIBRAMYCIN ) 100 MG capsule    Sig: Take 1 capsule (100 mg total) by mouth 2 (two) times daily.    Dispense:  20 capsule    Refill:  0    Supervising Provider:   BLAISE ALEENE KIDD [id number]   Patient with cold sore that may have superimposed infection/impetigo.  Will add abx coverage.  Return/follow-up precautions discussed.  Follow Up Instructions: I discussed the assessment and treatment plan with the patient. The patient was provided an opportunity to ask questions and all were answered. The patient agreed with the plan and demonstrated an understanding of the instructions.  A copy of instructions were sent to the patient via MyChart unless otherwise noted below.     The patient was advised to call back or seek an in-person evaluation if the symptoms worsen or if the condition fails to improve  as anticipated.    Lamar Schlossman, PA-C

## 2024-08-04 NOTE — Patient Instructions (Signed)
  Patricia Mendez, thank you for joining Lamar Schlossman, PA-C for today's virtual visit.  While this provider is not your primary care provider (PCP), if your PCP is located in our provider database this encounter information will be shared with them immediately following your visit.   A [facility] MyChart account gives you access to today's visit and all your visits, tests, and labs performed at [facility]  click here if you don't have a [facility] MyChart account or go to [email or web address]  Consent: (Patient) Patricia Mendez provided verbal consent for this virtual visit at the beginning of the encounter.  Current Medications:  Current Outpatient Medications:    doxycycline  (VIBRAMYCIN ) 100 MG capsule, Take 1 capsule (100 mg total) by mouth 2 (two) times daily., Disp: 20 capsule, Rfl: 0   mupirocin  ointment (BACTROBAN ) 2 %, Apply 1 Application topically 2 (two) times daily., Disp: 22 g, Rfl: 0   valACYclovir  (VALTREX ) 1000 MG tablet, Take 1 tablet (1,000 mg total) by mouth 3 (three) times daily., Disp: 21 tablet, Rfl: 0   levothyroxine  (SYNTHROID ) 175 MCG tablet, Take 1 tablet (175 mcg total) by mouth daily., Disp: 90 tablet, Rfl: 0   phentermine  37.5 MG capsule, Take 1 capsule (37.5 mg total) by mouth every morning., Disp: 30 capsule, Rfl: 0   valACYclovir  (VALTREX ) 1000 MG tablet, Take 2 tablets (2,000 mg total) by mouth 2 (two) times daily., Disp: 4 tablet, Rfl: 1   Medications ordered in this encounter:  Meds ordered this encounter  Medications   mupirocin  ointment (BACTROBAN ) 2 %    Sig: Apply 1 Application topically 2 (two) times daily.    Dispense:  22 g    Refill:  0    Supervising Provider:   BLAISE ALEENE KIDD [[id number]]   valACYclovir  (VALTREX ) 1000 MG tablet    Sig: Take 1 tablet (1,000 mg total) by mouth 3 (three) times daily.    Dispense:  21 tablet    Refill:  0    Supervising Provider:   BLAISE ALEENE KIDD [[id number]]   doxycycline  (VIBRAMYCIN ) 100  MG capsule    Sig: Take 1 capsule (100 mg total) by mouth 2 (two) times daily.    Dispense:  20 capsule    Refill:  0    Supervising Provider:   BLAISE ALEENE KIDD [[id number]]     *If you need refills on other medications prior to your next appointment, please contact your pharmacy*  Follow-Up: Call back or seek an in-person evaluation if the symptoms worsen or if the condition fails to improve as anticipated.  [facility] [phone]  Other Instructions    If you have been instructed to have an in-person evaluation today at a local Urgent Care facility, please use the link below. It will take you to a list of all of our available [facility], including address, phone number and hours of operation. Please do not delay care.  [facility]  If you or a family member do not have a primary care provider, use the link below to schedule a visit and establish care. When you choose a [facility] primary care physician or advanced practice provider, you gain a long-term partner in health. Find a Primary Care Provider  Learn more about [facility] in-office and virtual care options: [facility] Get Care Now

## 2024-08-20 ENCOUNTER — Encounter: Payer: Self-pay | Admitting: Family Medicine

## 2024-08-20 DIAGNOSIS — E66811 Obesity, class 1: Secondary | ICD-10-CM

## 2024-08-24 ENCOUNTER — Other Ambulatory Visit: Payer: Self-pay | Admitting: Family Medicine

## 2024-08-24 MED ORDER — PHENTERMINE HCL 37.5 MG PO CAPS: 37.5000 mg | ORAL_CAPSULE | ORAL | 0 refills | Status: DC

## 2024-08-30 ENCOUNTER — Other Ambulatory Visit: Payer: Self-pay | Admitting: Family Medicine

## 2024-08-30 DIAGNOSIS — E039 Hypothyroidism, unspecified: Secondary | ICD-10-CM

## 2024-09-01 ENCOUNTER — Encounter: Payer: Self-pay | Admitting: Family Medicine

## 2024-09-01 DIAGNOSIS — E039 Hypothyroidism, unspecified: Secondary | ICD-10-CM

## 2024-09-04 MED ORDER — LEVOTHYROXINE SODIUM 175 MCG PO TABS: 175.0000 ug | ORAL_TABLET | Freq: Every day | ORAL | 0 refills | Status: DC

## 2024-10-11 ENCOUNTER — Ambulatory Visit: Admitting: Family Medicine

## 2024-10-13 ENCOUNTER — Ambulatory Visit: Admitting: Family Medicine

## 2024-10-18 ENCOUNTER — Encounter: Payer: Self-pay | Admitting: Family Medicine

## 2024-10-18 ENCOUNTER — Ambulatory Visit (INDEPENDENT_AMBULATORY_CARE_PROVIDER_SITE_OTHER): Admitting: Family Medicine

## 2024-10-18 VITALS — BP 114/82 | HR 92 | Resp 16 | Ht 65.0 in | Wt 177.0 lb

## 2024-10-18 DIAGNOSIS — E6609 Other obesity due to excess calories: Secondary | ICD-10-CM | POA: Diagnosis not present

## 2024-10-18 DIAGNOSIS — E039 Hypothyroidism, unspecified: Secondary | ICD-10-CM

## 2024-10-18 DIAGNOSIS — E66811 Obesity, class 1: Secondary | ICD-10-CM

## 2024-10-18 DIAGNOSIS — Z6830 Body mass index (BMI) 30.0-30.9, adult: Secondary | ICD-10-CM | POA: Diagnosis not present

## 2024-10-18 MED ORDER — LEVOTHYROXINE SODIUM 175 MCG PO TABS: 175.0000 ug | ORAL_TABLET | Freq: Every day | ORAL | 1 refills | Status: AC

## 2024-10-18 MED ORDER — PHENTERMINE HCL 37.5 MG PO CAPS: 37.5000 mg | ORAL_CAPSULE | ORAL | 0 refills | Status: AC

## 2024-10-18 MED ORDER — PHENTERMINE HCL 37.5 MG PO CAPS
37.5000 mg | ORAL_CAPSULE | ORAL | 0 refills | Status: AC
Start: 2024-10-18 — End: 2024-11-17

## 2024-10-18 MED ORDER — PHENTERMINE HCL 37.5 MG PO CAPS
37.5000 mg | ORAL_CAPSULE | ORAL | 0 refills | Status: AC
Start: 2024-11-16 — End: 2024-12-16

## 2024-10-18 NOTE — Progress Notes (Signed)
 Established Patient Office Visit  Subjective  Patient ID: Patricia Mendez, female    DOB: [birth year]  Age: 43 y.o. MRN: [id number]  Chief Complaint  Patient presents with   Obesity   Discussed the use of AI scribe software for clinical note transcription with the patient, who gave verbal consent to proceed.  History of Present Illness   Patricia Mendez is a 43 year old female who presents for a weight loss follow-up.  She currently weighs 177 pounds, down from 185 pounds at her last visit. The patient reports that her weight loss is due to an increase in her phentermine  medication, which she feels is helping suppress her appetite. Her goal weight is 165 pounds, and she notes feeling a difference when her weight exceeds 180 pounds.  Her exercise regimen includes walking, aiming for 10,000 steps a day. She uses a walking treadmill with a standing desk setup, allowing her to walk for hours while working.  She has been on thyroid  medication for 16 years and recently had her TSH levels checked in August. She typically checks her thyroid  levels every six months to a year and is concerned about missing doses if there is a delay in refilling her prescription.      Wt Readings from Last 3 Encounters:  10/18/24 177 lb (80.3 kg)  07/10/24 185 lb 12.8 oz (84.3 kg)  06/12/24 184 lb (83.5 kg)   ROS: see HPI    Objective:     BP 114/82   Pulse 92   Resp 16   Ht 5' 5 (1.651 m)   Wt 177 lb (80.3 kg)   SpO2 99%   BMI 29.45 kg/m  BP Readings from Last 3 Encounters:  10/18/24 114/82  07/10/24 98/65  06/12/24 108/74    Physical Exam Vitals reviewed.  Constitutional:      Appearance: Normal appearance.  Cardiovascular:     Rate and Rhythm: Normal rate and regular rhythm.     Pulses: Normal pulses.     Heart sounds: Normal heart sounds.  Pulmonary:     Effort: Pulmonary effort is normal.     Breath sounds: Normal breath sounds.  Neurological:     Mental Status: She is alert.   Psychiatric:        Mood and Affect: Mood normal.        Behavior: Behavior normal.     Assessment & Plan:  1. Class 1 obesity due to excess calories without serious comorbidity with body mass index (BMI) of 30.0 to 30.9 in adult (Primary) Weight reduced to 177 lbs from 185 lbs. Phentermine  effectively suppressing appetite at dose of 37.5mg  daily. Vital signs stable, no hypertension or tachycardia. Denies increase in anxiety or sleep issues. Regular physical activity with a goal of 10,000 steps daily. Target weight is 165 lbs. Continue phentermine  for weight loss. Current exercise regimen includes walking and standing desk treadmill. Scheduled three-month follow-up to monitor progress and adjust treatment. - phentermine  37.5 MG capsule; Take 1 capsule (37.5 mg total) by mouth every morning.  Dispense: 30 capsule; Refill: 0 - phentermine  37.5 MG capsule; Take 1 capsule (37.5 mg total) by mouth every morning.  Dispense: 30 capsule; Refill: 0 - phentermine  37.5 MG capsule; Take 1 capsule (37.5 mg total) by mouth every morning.  Dispense: 30 capsule; Refill: 0  2. Acquired hypothyroidism Chronic hypothyroidism managed with medication. Last TSH checked in August and was within normal limits, no immediate re-evaluation needed. Concern about missing doses if refills  delayed. Refilled thyroid  medication prescription. - levothyroxine  (SYNTHROID ) 175 MCG tablet; Take 1 tablet (175 mcg total) by mouth daily.  Dispense: 90 tablet; Refill: 1   Return in about 3 months (around 01/18/2025) for weight loss f/u .    Evalene Arts, FNP

## 2024-12-04 ENCOUNTER — Encounter

## 2024-12-04 ENCOUNTER — Encounter: Payer: Self-pay | Admitting: *Deleted

## 2025-01-18 ENCOUNTER — Ambulatory Visit: Admitting: Family Medicine
